# Patient Record
Sex: Female | Born: 1980 | Race: Black or African American | Hispanic: No | Marital: Married | Smoking: Never smoker
Health system: Southern US, Community
[De-identification: ages and names within clinical notes are randomized; demographics above are authoritative.]

## PROBLEM LIST (undated history)

## (undated) DIAGNOSIS — Z972 Presence of dental prosthetic device (complete) (partial): Secondary | ICD-10-CM

## (undated) DIAGNOSIS — Z2821 Immunization not carried out because of patient refusal: Secondary | ICD-10-CM

## (undated) DIAGNOSIS — T7840XA Allergy, unspecified, initial encounter: Secondary | ICD-10-CM

## (undated) DIAGNOSIS — Z01419 Encounter for gynecological examination (general) (routine) without abnormal findings: Secondary | ICD-10-CM

## (undated) DIAGNOSIS — E282 Polycystic ovarian syndrome: Secondary | ICD-10-CM

## (undated) DIAGNOSIS — Z973 Presence of spectacles and contact lenses: Secondary | ICD-10-CM

## (undated) DIAGNOSIS — L209 Atopic dermatitis, unspecified: Secondary | ICD-10-CM

## (undated) HISTORY — DX: Atopic dermatitis, unspecified: L20.9

## (undated) HISTORY — DX: Presence of spectacles and contact lenses: Z97.3

## (undated) HISTORY — PX: CHOLECYSTECTOMY: SHX55

## (undated) HISTORY — DX: Presence of dental prosthetic device (complete) (partial): Z97.2

## (undated) HISTORY — PX: WISDOM TOOTH EXTRACTION: SHX21

## (undated) HISTORY — DX: Allergy, unspecified, initial encounter: T78.40XA

## (undated) HISTORY — DX: Immunization not carried out because of patient refusal: Z28.21

## (undated) HISTORY — DX: Polycystic ovarian syndrome: E28.2

## (undated) HISTORY — DX: Encounter for gynecological examination (general) (routine) without abnormal findings: Z01.419

---

## 2010-10-06 ENCOUNTER — Emergency Department (HOSPITAL_COMMUNITY)
Admission: EM | Admit: 2010-10-06 | Discharge: 2010-10-06 | Disposition: A | Discharge status: Home / Self Care | Payer: BC Managed Care – PPO | Attending: Emergency Medicine | Admitting: Emergency Medicine

## 2010-10-06 ENCOUNTER — Emergency Department (HOSPITAL_COMMUNITY): Payer: BC Managed Care – PPO

## 2010-10-06 DIAGNOSIS — M79609 Pain in unspecified limb: Secondary | ICD-10-CM | POA: Insufficient documentation

## 2010-10-06 DIAGNOSIS — L03039 Cellulitis of unspecified toe: Secondary | ICD-10-CM | POA: Insufficient documentation

## 2010-10-06 DIAGNOSIS — M7989 Other specified soft tissue disorders: Secondary | ICD-10-CM | POA: Insufficient documentation

## 2010-10-06 DIAGNOSIS — L02619 Cutaneous abscess of unspecified foot: Secondary | ICD-10-CM | POA: Insufficient documentation

## 2010-10-06 DIAGNOSIS — J45909 Unspecified asthma, uncomplicated: Secondary | ICD-10-CM | POA: Insufficient documentation

## 2010-10-20 ENCOUNTER — Ambulatory Visit (INDEPENDENT_AMBULATORY_CARE_PROVIDER_SITE_OTHER): Payer: BC Managed Care – PPO | Admitting: Family Medicine

## 2010-10-20 DIAGNOSIS — J309 Allergic rhinitis, unspecified: Secondary | ICD-10-CM

## 2010-10-20 DIAGNOSIS — J45909 Unspecified asthma, uncomplicated: Secondary | ICD-10-CM

## 2010-11-10 ENCOUNTER — Encounter: Payer: BC Managed Care – PPO | Admitting: Family Medicine

## 2011-03-30 ENCOUNTER — Ambulatory Visit (INDEPENDENT_AMBULATORY_CARE_PROVIDER_SITE_OTHER): Payer: BC Managed Care – PPO | Admitting: Medical

## 2011-03-30 ENCOUNTER — Other Ambulatory Visit: Payer: Self-pay | Admitting: Medical

## 2011-03-30 ENCOUNTER — Encounter: Payer: Self-pay | Admitting: Medical

## 2011-03-30 VITALS — BP 130/90 | HR 78 | Temp 97.7°F | Resp 20 | Ht 61.0 in | Wt 175.0 lb

## 2011-03-30 DIAGNOSIS — J329 Chronic sinusitis, unspecified: Secondary | ICD-10-CM

## 2011-03-30 DIAGNOSIS — IMO0001 Reserved for inherently not codable concepts without codable children: Secondary | ICD-10-CM

## 2011-03-30 DIAGNOSIS — L03019 Cellulitis of unspecified finger: Secondary | ICD-10-CM

## 2011-03-30 MED ORDER — ALBUTEROL SULFATE HFA 108 (90 BASE) MCG/ACT IN AERS: INHALATION_SPRAY | RESPIRATORY_TRACT | Status: DC

## 2011-03-30 MED ORDER — HYDROCODONE-ACETAMINOPHEN 5-500 MG PO TABS: 1.0000 | ORAL_TABLET | ORAL | Status: DC | PRN

## 2011-03-30 MED ORDER — AMOXICILLIN-POT CLAVULANATE 875-125 MG PO TABS: 1.0000 | ORAL_TABLET | Freq: Two times a day (BID) | ORAL | Status: AC

## 2011-03-30 NOTE — Patient Instructions (Signed)
I refilled your albuterol inhaler today.  I am treating your sinus infection and finger infection today with Augmentin.    Keep the finger clean and dry.  You may use a sterile bandage twice daily until the wound heals.    Call if any concerns.  Recheck on both sinuses and finger in 1 week if not better.  Paronychia (Felon, Whitlow) Paronychia is an inflammatory reaction involving the folds of the skin surrounding the fingernail. This is commonly caused by an infection in the skin around a nail. The most common cause of paronychia is frequent wetting of the hands (as seen with bartenders, food servers, nurses or others who wet their hands). This makes the skin around the fingernail susceptible to infection by bacteria (germs) or fungus. Other predisposing factors are:  Aggressive manicuring.   Nail biting.   Thumb sucking.  The most common cause is a staphylococcal (a type of germ) infection, or a fungal (Candida) infection. When caused by a germ, it usually comes on suddenly with redness, swelling, pus and is often painful. It may get under the nail and form an abscess (collection of pus), or form an abscess around the nail. If the nail itself is infected with a fungus, the treatment is usually prolonged and may require oral medicine for up to one year. Your caregiver will determine the length of time treatment is required. The paronychia caused by bacteria (germs) may largely be avoided by not pulling on hangnails or picking at cuticles. When the infection occurs at the tips of the finger it is called felon. When the cause of paronychia is from the herpes simplex virus (HSV) it is called herpetic whitlow. TREATMENT  When an abscess is present treatment is often incision and drainage. This means that the abscess must be cut open so the pus can get out. When this is done, the following home care instructions should be followed.  HOME CARE INSTRUCTIONS  It is important to keep the affected fingers  very dry. Rubber or plastic gloves over cotton gloves should be used whenever the hand must be placed in water.   Keep wound clean, dry and dressed as suggested by your caregiver between warm soaks or warm compresses.   Soak in warm water for fifteen to twenty minutes three to four times per day for bacterial infections. Fungal infections are very difficult to treat, so often require treatment for long periods of time.   For bacterial (germ) infections take antibiotics (medicine which kill germs) as directed and finish the prescription, even if the problem appears to be solved before the medicine is gone.   Only take over-the-counter or prescription medicines for pain, discomfort, or fever as directed by your caregiver.  SEEK IMMEDIATE MEDICAL CARE IF :  There is redness, swelling, or increasing pain in the wound.   Pus is coming from wound.   An unexplained oral temperature above 101 develops.   You notice a foul smell coming from the wound or dressing.  Document Released: 12/29/2000 Document Re-Released: 10/01/2008 [facility] Patient Information 2011 [place], [place].  Sinusitis Sinuses are air pockets within the bones of your face. The growth of bacteria within a sinus leads to infection. Infection keeps the sinuses from draining. This infection is called sinusitis. SYMPTOMS There will be different areas of pain depending on which sinuses have become infected.  The maxillary sinuses often produce pain beneath the eyes.   Frontal sinusitis may cause pain in the middle of the forehead and above the  eyes.  Other problems (symptoms) include:  Toothaches.   Colored, pus-like (purulent) drainage from the nose.   Any swelling, warmth, or tenderness over the sinus areas may be signs of infection.  TREATMENT Sinusitis is most often determined by an exam and you may have x-rays taken. If x-rays have been taken, make sure you obtain your results. Or find out how you are to obtain them. Your  caregiver may give you medications (antibiotics). These are medications that will help kill the infection. You may also be given a medication (decongestant) that helps to reduce sinus swelling.  HOME CARE INSTRUCTIONS  Only take over-the-counter or prescription medicines for pain, discomfort, or fever as directed by your caregiver.   Drink extra fluids. Fluids help thin the mucus so your sinuses can drain more easily.   Applying either moist heat or ice packs to the sinus areas may help relieve discomfort.   Use saline nasal sprays to help moisten your sinuses. The sprays can be found at your local drugstore.  SEEK IMMEDIATE MEDICAL CARE IF YOU DEVELOP:  High fever that is still present after two days of antibiotic treatment.   Increasing pain, severe headaches, or toothache.   Nausea, vomiting, or drowsiness.   Unusual swelling around the face or trouble seeing.  MAKE SURE YOU:   Understand these instructions.   Will watch your condition.   Will get help right away if you are not doing well or get worse.  Document Released: 07/05/2005 Document Re-Released: 06/17/2008 [facility] Patient Information 2011 [place], [place].

## 2011-03-30 NOTE — Progress Notes (Signed)
Subjective:   HPI  Regina Stevens is a 30 y.o. female who presents for for c/o right middle finger pain and swelling x 3 days, but worse overnight. Denies injury to finger, no recent nail biting or manicure.  No prior similar.  No drainage.     She notes sinus pressure, mucous and congestion in nose/sinuses, thick yellow green discharge from nose x 2 days.  No other aggravating or relieving factors.  No other c/o.  The following portions of the patient's history were reviewed and updated as appropriate: allergies, current medications, past family history, past medical history, past social history, past surgical history and problem list.  Past Medical History  Diagnosis Date  . Asthma   . Allergy    Review of Systems Constitutional: anorexia; denies fever, chills, sweats  Allergy: +congestion, sneezing Cardiology: denies chest pain, palpitations, edema Respiratory: denies cough, shortness of breath, wheezing Gastroenterology: denies abdominal pain, nausea, vomiting, diarrhea, constipation Neurology: no headache, weakness, tingling, numbness      Objective:   Physical Exam  General appearance: alert, no distress, WD/WN, ill appearing Skin: right middle finger with soft tisue erythema, tenderness and swelling beside nail bed c/w paronychia  HEENT: normocephalic, nares patent, +swelling of turbinates, muculopurulent discharge, pharynx normal Oral cavity: MMM, no lesions Neck: supple, no lymphadenopathy, no thyromegaly, no masses Heart: RRR, normal S1, S2, no murmurs Lungs: CTA bilaterally, no wheezes, rhonchi, or rales Pulses: 2+ symmetric, upper extremities, normal cap refill Neurological: normal finger sensation and strength   Assessment :    Encounter Diagnoses  Name Primary?  . Paronychia of third finger, right Yes  . Sinusitis     Plan:   Paronychia - right middle finger.  Discussed risks and benefits of procedure and she gives consent.  Use 1% lidocaine without  epi for digital block.  Cleaned and prepped in usual sterile fashion.  Used #11 blade to incise.  <0.5 cc of pus expressed, culture taken.  Discussed wound care, epsom salt soaks.  Begin Augmentin, Lortab for pain.  Call if worse in the next 1-2 days.    Sinusitis - hydrate well, rest, discussed syptomatic therapy.  Augmentin script given  Call if not improving in the next few days.

## 2011-04-02 ENCOUNTER — Telehealth: Payer: Self-pay | Admitting: *Deleted

## 2011-04-02 NOTE — Telephone Encounter (Addendum)
Message copied by Dorthula Perfect on Fri Apr 02, 2011 11:17 AM ------      Message from: Aleen Campi, DAVID S      Created: Thu Apr 01, 2011  7:35 AM       So far no growth on culture.  Call and see how she is doing? How is the finger?  Spoke with pt regarding culture from finger and informed pt that no growth on culture.  Pt stated she is doing fine and that finger is getting better, the swelling is going down but still a little sore.  Advised pt to call if finger got worse. CM, LPN

## 2011-04-26 ENCOUNTER — Encounter: Payer: Self-pay | Admitting: Family Medicine

## 2011-04-27 ENCOUNTER — Ambulatory Visit (INDEPENDENT_AMBULATORY_CARE_PROVIDER_SITE_OTHER): Payer: BC Managed Care – PPO | Admitting: Medical

## 2011-04-27 ENCOUNTER — Encounter: Payer: Self-pay | Admitting: Medical

## 2011-04-27 VITALS — BP 100/80 | HR 62 | Temp 98.3°F | Resp 16 | Ht 59.5 in | Wt 176.0 lb

## 2011-04-27 DIAGNOSIS — J309 Allergic rhinitis, unspecified: Secondary | ICD-10-CM

## 2011-04-27 DIAGNOSIS — J45909 Unspecified asthma, uncomplicated: Secondary | ICD-10-CM

## 2011-04-27 DIAGNOSIS — Z111 Encounter for screening for respiratory tuberculosis: Secondary | ICD-10-CM

## 2011-04-27 DIAGNOSIS — Z0000 Encounter for general adult medical examination without abnormal findings: Secondary | ICD-10-CM

## 2011-04-27 DIAGNOSIS — E663 Overweight: Secondary | ICD-10-CM

## 2011-04-27 DIAGNOSIS — B3731 Acute candidiasis of vulva and vagina: Secondary | ICD-10-CM

## 2011-04-27 DIAGNOSIS — B373 Candidiasis of vulva and vagina: Secondary | ICD-10-CM

## 2011-04-27 LAB — POCT URINALYSIS DIPSTICK
Blood, UA: NEGATIVE
Glucose, UA: NEGATIVE
Ketones, UA: NEGATIVE
Spec Grav, UA: 1.015

## 2011-04-27 MED ORDER — TUBERCULIN PPD 5 UNIT/0.1ML ID SOLN: 5.0000 [IU] | Freq: Once | INTRADERMAL | Status: DC

## 2011-04-27 MED ORDER — MOMETASONE FUROATE 50 MCG/ACT NA SUSP: 2.0000 | Freq: Every day | NASAL | Status: DC

## 2011-04-27 MED ORDER — FLUCONAZOLE 150 MG PO TABS: ORAL_TABLET | ORAL | Status: AC

## 2011-04-27 NOTE — Patient Instructions (Signed)
I want you to work on weight loss.  Try and maintain 1800 calories per day diet.  Change up your exercise routine regularly to trick the body to lose weight.  Don't skip meals in general.    Preventative Care for Adults - Female      MAINTAIN REGULAR HEALTH EXAMS:  A routine yearly physical is a good way to check in with your primary care provider about your health and preventive screening. It is also an opportunity to share updates about your health and any concerns you have, and receive a thorough all-over exam.   Most health insurance companies pay for at least some preventative services.  Check with your health plan for specific coverages.  WHAT PREVENTATIVE SERVICES DO WOMEN NEED?  Adult women should have their weight and blood pressure checked regularly.   Women age 51 and older should have their cholesterol levels checked regularly.  Women should be screened for cervical cancer with a Pap smear and pelvic exam beginning at either age 5, or 3 years after they become sexually activity.    Breast cancer screening generally begins at age 49 with a mammogram and breast exam by your primary care provider.    Beginning at age 43 and continuing to age 29, women should be screened for colorectal cancer.  Certain people may need continued testing until age 76.  Updating vaccinations is part of preventative care.  Vaccinations help protect against diseases such as the flu.  Osteoporosis is a disease in which the bones lose minerals and strength as we age. Women ages 29 and over should discuss this with their caregivers, as should women after menopause who have other risk factors.  Lab tests are generally done as part of preventative care to screen for anemia and blood disorders, to screen for problems with the kidneys and liver, to screen for bladder problems, to check blood sugar, and to check your cholesterol level.  Preventative services generally include counseling about diet, exercise,  avoiding tobacco, drugs, excessive alcohol consumption, and sexually transmitted infections.    GENERAL RECOMMENDATIONS FOR GOOD HEALTH:  Healthy diet:  Eat a variety of foods, including fruit, vegetables, animal or vegetable protein, such as meat, fish, chicken, and eggs, or beans, lentils, tofu, and grains, such as rice.  Drink plenty of water daily.  Decrease saturated fat in the diet, avoid lots of red meat, processed foods, sweets, fast foods, and fried foods.  Exercise:  Aerobic exercise helps maintain good heart health. At least 30-40 minutes of moderate-intensity exercise is recommended. For example, a brisk walk that increases your heart rate and breathing. This should be done on most days of the week.   Find a type of exercise or a variety of exercises that you enjoy so that it becomes a part of your daily life.  Examples are running, walking, swimming, water aerobics, and biking.  For motivation and support, explore group exercise such as aerobic class, spin class, Zumba, Yoga,or  martial arts, etc.    Set exercise goals for yourself, such as a certain weight goal, walk or run in a race such as a 5k walk/run.  Speak to your primary care provider about exercise goals.  Disease prevention:  If you smoke or chew tobacco, find out from your caregiver how to quit. It can literally save your life, no matter how long you have been a tobacco user. If you do not use tobacco, never begin.   Maintain a healthy diet and normal weight. Increased  weight leads to problems with blood pressure and diabetes.   The Body Mass Index or BMI is a way of measuring how much of your body is fat. Having a BMI above 27 increases the risk of heart disease, diabetes, hypertension, stroke and other problems related to obesity. Your caregiver can help determine your BMI and based on it develop an exercise and dietary program to help you achieve or maintain this important measurement at a healthful  level.  High blood pressure causes heart and blood vessel problems.  Persistent high blood pressure should be treated with medicine if weight loss and exercise do not work.   Fat and cholesterol leaves deposits in your arteries that can block them. This causes heart disease and vessel disease elsewhere in your body.  If your cholesterol is found to be high, or if you have heart disease or certain other medical conditions, then you may need to have your cholesterol monitored frequently and be treated with medication.   Ask if you should have a cardiac stress test if your history suggests this. A stress test is a test done on a treadmill that looks for heart disease. This test can find disease prior to there being a problem.  Menopause can be associated with physical symptoms and risks. Hormone replacement therapy is available to decrease these. You should talk to your caregiver about whether starting or continuing to take hormones is right for you.   Osteoporosis is a disease in which the bones lose minerals and strength as we age. This can result in serious bone fractures. Risk of osteoporosis can be identified using a bone density scan. Women ages 93 and over should discuss this with their caregivers, as should women after menopause who have other risk factors. Ask your caregiver whether you should be taking a calcium supplement and Vitamin D, to reduce the rate of osteoporosis.   Avoid drinking alcohol in excess (more than two drinks per day).  Avoid use of street drugs. Do not share needles with anyone. Ask for professional help if you need assistance or instructions on stopping the use of alcohol, cigarettes, and/or drugs.  Brush your teeth twice a day with fluoride toothpaste, and floss once a day. Good oral hygiene prevents tooth decay and gum disease. The problems can be painful, unattractive, and can cause other health problems. Visit your dentist for a routine oral and dental check up and  preventive care every 6-12 months.   Look at your skin regularly.  Use a mirror to look at your back. Notify your caregivers of changes in moles, especially if there are changes in shapes, colors, a size larger than a pencil eraser, an irregular border, or development of new moles.  Safety:  Use seatbelts 100% of the time, whether driving or as a passenger.  Use safety devices such as hearing protection if you work in environments with loud noise or significant background noise.  Use safety glasses when doing any work that could send debris in to the eyes.  Use a helmet if you ride a bike or motorcycle.  Use appropriate safety gear for contact sports.  Talk to your caregiver about gun safety.  Use sunscreen with a SPF (or skin protection factor) of 15 or greater.  Lighter skinned people are at a greater risk of skin cancer. Don't forget to also wear sunglasses in order to protect your eyes from too much damaging sunlight. Damaging sunlight can accelerate cataract formation.   Practice safe sex. Use condoms.  Condoms are used for birth control and to help reduce the spread of sexually transmitted infections (or STIs).  Some of the STIs are gonorrhea (the clap), chlamydia, syphilis, trichomonas, herpes, HPV (human papilloma virus) and HIV (human immunodeficiency virus) which causes AIDS. The herpes, HIV and HPV are viral illnesses that have no cure. These can result in disability, cancer and death.   Keep carbon monoxide and smoke detectors in your home functioning at all times. Change the batteries every 6 months or use a model that plugs into the wall.   Vaccinations:  Stay up to date with your tetanus shots and other required immunizations. You should have a booster for tetanus every 10 years. Be sure to get your flu shot every year, since 5%-20% of the [place] population comes down with the flu. The flu vaccine changes each year, so being vaccinated once is not enough. Get your shot in the fall, before  the flu season peaks.   Other vaccines to consider:  Human Papilloma Virus or HPV causes cancer of the cervix, and other infections that can be transmitted from person to person. There is a vaccine for HPV, and females should get immunized between the ages of 22 and 42. It requires a series of 3 shots.   Pneumococcal vaccine to protect against certain types of pneumonia.  This is normally recommended for adults age 70 or older.  However, adults younger than 29 years old with certain underlying conditions such as diabetes, heart or lung disease should also receive the vaccine.  Shingles vaccine to protect against Varicella Zoster if you are older than age 65, or younger than 29 years old with certain underlying illness.  Hepatitis A vaccine to protect against a form of infection of the liver by a virus acquired from food.  Hepatitis B vaccine to protect against a form of infection of the liver by a virus acquired from blood or body fluids, particularly if you work in health care.  If you plan to travel internationally, check with your local health department for specific vaccination recommendations.  Cancer Screening:  Breast cancer screening is essential to preventive care for women. All women age 71 and older should perform a breast self-exam every month. At age 13 and older, women should have their caregiver complete a breast exam each year. Women at ages 69 and older should have a mammogram (x-ray film) of the breasts. Your caregiver can discuss how often you need mammograms.    Cervical cancer screening includes taking a Pap smear (sample of cells examined under a microscope) from the cervix (end of the uterus). It also includes testing for HPV (Human Papilloma Virus, which can cause cervical cancer). Screening and a pelvic exam should begin at age 29, or 3 years after a woman becomes sexually active. Screening should occur every year, with a Pap smear but no HPV testing, up to age 59. After  age 60, you should have a Pap smear every 3 years with HPV testing, if no HPV was found previously.   Most routine colon cancer screening begins at the age of 64. On a yearly basis, doctors may provide special easy to use take-home tests to check for hidden blood in the stool. Sigmoidoscopy or colonoscopy can detect the earliest forms of colon cancer and is life saving. These tests use a small camera at the end of a tube to directly examine the colon. Speak to your caregiver about this at age 33, when routine screening begins (and is  repeated every 5 years unless early forms of pre-cancerous polyps or small growths are found).

## 2011-04-27 NOTE — Progress Notes (Signed)
Subjective:   HPI  Regina Stevens is a 30 y.o. female who presents for a complete physical.  I saw her recently for paronychia with resolved after I&D and Augmentin.    She recently had some lab work through gynecology (Dr. Annamaria Helling at [facility]) at her yearly visit. Pap was normal.  Gyn hx/o T0 P0 A1 L0. Last tetanus booster a few years.  Decline flu shot.    Asthma is relatively controlled on current medication.  Flare ups usually from a scent or odor.  Last flare up 4 nights ago.    Has some ongoing sinus congestion.  Saw me recently for paronychia and sinusitis, and only used Augmentin for 4 days.  She c/o itching and whitish vaginal discharge since beginning Augmenting, and thins its a yeast infection due to begin on the antibiotic.  She has had this in the past from antibiotics.  Still has the sinus congestion despite her current medication.  Reviewed their medical, surgical, family, social, medication, and allergy history and updated chart as appropriate.  Past Medical History  Diagnosis Date  . Asthma   . Allergy   . Spontaneous abortion 2006  . Wears glasses     Past Surgical History  Procedure Date  . Cholecystectomy     Family History  Problem Relation Age of Onset  . Hypertension Mother   . Angina Mother   . Cancer Paternal Uncle   . Heart disease Maternal Grandmother   . Cancer Maternal Grandmother   . Stroke Neg Hx   . Diabetes Neg Hx   . COPD Neg Hx   . Hyperlipidemia Neg Hx   . Kidney disease Neg Hx   . Asthma Other     History   Social History  . Marital Status: Married    Spouse Name: N/A    Number of Children: N/A  . Years of Education: N/A   Occupational History  . unemployed    Social History Main Topics  . Smoking status: Never Smoker   . Smokeless tobacco: Never Used  . Alcohol Use: No  . Drug Use: No  . Sexually Active: Not on file   Other Topics Concern  . Not on file   Social History Narrative   Exercise -  walks 2 miles daily; No children    Current Outpatient Prescriptions on File Prior to Visit  Medication Sig Dispense Refill  . albuterol (PROVENTIL HFA;VENTOLIN HFA) 108 (90 BASE) MCG/ACT inhaler 2 puffs q6 hours as needed for wheezing  1 Inhaler  3  . fexofenadine (ALLEGRA) 30 MG tablet Take 30 mg by mouth 2 (two) times daily.        . fluticasone (FLONASE) 50 MCG/ACT nasal spray Place 2 sprays into the nose daily.        . fluticasone-salmeterol (ADVAIR HFA) 115-21 MCG/ACT inhaler Inhale 2 puffs into the lungs daily.        . montelukast (SINGULAIR) 10 MG tablet Take 10 mg by mouth at bedtime.          Allergies  Allergen Reactions  . Benadryl (Altaryl)     Swelling, hives  . Iodine   . Morphine And Related   . Shellfish Allergy      Review of Systems Constitutional: denies fever, chills, sweats, unexpected weight change, anorexia, fatigue Allergy: +sneezing, itching, congestion Dermatology: denies changing moles, rash, lumps, new worrisome lesions ENT: no runny nose, ear pain, sore throat, hoarseness, teeth pain, tinnitus, hearing loss, epistaxis Cardiology: denies  chest pain, palpitations, edema, orthopnea, paroxysmal nocturnal dyspnea Respiratory: denies cough, shortness of breath, dyspnea on exertion, wheezing, hemoptysis Gastroenterology: denies abdominal pain, nausea, vomiting, diarrhea, constipation, blood in stool, changes in bowel movement, dysphagia Hematology: denies bleeding or bruising problems Musculoskeletal: denies arthralgias, myalgias, joint swelling, back pain, neck pain, cramping, gait changes Ophthalmology: denies vision changes, eye redness, itching, discharge Urology: denies dysuria, difficulty urinating, hematuria, urinary frequency, urgency, incontinence Neurology: no headache, weakness, tingling, numbness, speech abnormality, memory loss, falls, dizziness Psychology: denies depressed mood, agitation, sleep problems     Objective:   Physical  Exam  Filed Vitals:   04/27/11 1034  BP: 100/80  Temp: 98.3 F (36.8 C)    General appearance: alert, no distress, WD/WN, black  female, looks stated age, overweight Skin: unremarkable, few scattered benign appearing macules HEENT: normocephalic, conjunctiva/corneas normal, sclerae anicteric, PERRLA, EOMi, nares with clear discharge, right turbinate swollen and pale, not patent, pharynx normal Oral cavity: MMM, tongue normal, few bottom teeth with decay, but otherwise teeth in good repair Neck: supple, no lymphadenopathy, no thyromegaly, no masses, normal ROM, no bruits Chest: non tender, normal shape and expansion Heart: RRR, normal S1, S2, no murmurs Lungs: CTA bilaterally, no wheezes, rhonchi, or rales Abdomen: +bs, soft, non tender, non distended, no masses, no hepatomegaly, no splenomegaly, no bruits Back: non tender, normal ROM, no scoliosis Musculoskeletal: upper extremities non tender, no obvious deformity, normal ROM throughout, lower extremities non tender, no obvious deformity, normal ROM throughout Extremities: no edema, no cyanosis, no clubbing Pulses: 2+ symmetric, upper and lower extremities, normal cap refill Neurological: alert, oriented x 3, CN2-12 intact, strength normal upper extremities and lower extremities, sensation normal throughout, DTRs 2+ throughout, no cerebellar signs, gait normal Psychiatric: normal affect, behavior normal, pleasant  Breast/pelvic deferred to gyn   Assessment :    Encounter Diagnoses  Name Primary?  . Routine general medical examination at a health care facility Yes  . Overweight   . PPD screening test   . Yeast vaginitis   . Asthma   . Allergic rhinitis      Plan:    Physical exam - I reviewed labs and records that were faxed over by gynecology.  Pap smear normal 03/03/11, CBC, CMET, Lipid, TSH all normal from 03/04/11.  Discussed healthy lifestyle, diet, exercise, preventative care, vaccinations, and addressed their concerns.   F/u soon as planned with dentist for lower caries.  See eye doctor yearly.  Overweight - advised 1800 calories/day diet until desired weight reached.  We discussed exercise, diet, strategies to lose weight.  recheck 41mo.  PPD screening - PPD placed.  Return in 48-72 hours for reading.  Adoption form pending PPD reading.    Yeast Vaginitis - Diflucan script given.    Asthma - controlled on current medication.  Allergies - will try switching to sample of nasonex instead of Fluticasone for next several weeks.  If not improving cal back. Otherwise c/t same regimen.

## 2011-04-29 ENCOUNTER — Other Ambulatory Visit: Payer: BC Managed Care – PPO

## 2011-04-29 LAB — TB SKIN TEST
Induration: 48
TB Skin Test: NEGATIVE mm

## 2011-04-29 NOTE — Progress Notes (Signed)
Patient had a TB skin test read today at our office today and the results from the test was negative with 0 mm. I made a mistaken and add 48 in the mm section I thought it was the length of time since she had and the time she came in for it to be read. CLS

## 2011-06-11 ENCOUNTER — Emergency Department (HOSPITAL_COMMUNITY): Payer: BC Managed Care – PPO

## 2011-06-11 ENCOUNTER — Emergency Department (HOSPITAL_COMMUNITY)
Admission: EM | Admit: 2011-06-11 | Discharge: 2011-06-11 | Disposition: A | Discharge status: Home / Self Care | Payer: BC Managed Care – PPO | Attending: Emergency Medicine | Admitting: Emergency Medicine

## 2011-06-11 ENCOUNTER — Encounter (HOSPITAL_COMMUNITY): Payer: Self-pay | Admitting: *Deleted

## 2011-06-11 DIAGNOSIS — J45909 Unspecified asthma, uncomplicated: Secondary | ICD-10-CM | POA: Insufficient documentation

## 2011-06-11 DIAGNOSIS — M25569 Pain in unspecified knee: Secondary | ICD-10-CM | POA: Insufficient documentation

## 2011-06-11 DIAGNOSIS — M79609 Pain in unspecified limb: Secondary | ICD-10-CM | POA: Insufficient documentation

## 2011-06-11 DIAGNOSIS — IMO0002 Reserved for concepts with insufficient information to code with codable children: Secondary | ICD-10-CM | POA: Insufficient documentation

## 2011-06-11 DIAGNOSIS — S8391XA Sprain of unspecified site of right knee, initial encounter: Secondary | ICD-10-CM

## 2011-06-11 DIAGNOSIS — Z79899 Other long term (current) drug therapy: Secondary | ICD-10-CM | POA: Insufficient documentation

## 2011-06-11 DIAGNOSIS — Z9889 Other specified postprocedural states: Secondary | ICD-10-CM | POA: Insufficient documentation

## 2011-06-11 DIAGNOSIS — X500XXA Overexertion from strenuous movement or load, initial encounter: Secondary | ICD-10-CM | POA: Insufficient documentation

## 2011-06-11 DIAGNOSIS — M25559 Pain in unspecified hip: Secondary | ICD-10-CM | POA: Insufficient documentation

## 2011-06-11 LAB — POCT PREGNANCY, URINE: Preg Test, Ur: NEGATIVE

## 2011-06-11 MED ORDER — HYDROMORPHONE HCL PF 2 MG/ML IJ SOLN
2.0000 mg | Freq: Once | INTRAMUSCULAR | Status: AC
  Administered 2011-06-11: 2 mg via INTRAMUSCULAR
  Filled 2011-06-11: qty 1

## 2011-06-11 MED ORDER — OXYCODONE-ACETAMINOPHEN 5-325 MG PO TABS: 1.0000 | ORAL_TABLET | ORAL | Status: AC | PRN

## 2011-06-11 MED ORDER — IBUPROFEN 800 MG PO TABS: 800.0000 mg | ORAL_TABLET | Freq: Three times a day (TID) | ORAL | Status: AC

## 2011-06-11 NOTE — ED Notes (Signed)
Waiting for return call from ortho

## 2011-06-11 NOTE — ED Notes (Signed)
Patient states she twisted her right leg when cleaning on Thurs morning.  She states she has not been able to bend or straighten her leg since the event.

## 2011-06-11 NOTE — ED Provider Notes (Signed)
Medical screening examination/treatment/procedure(s) were performed by non-physician practitioner and as supervising physician I was immediately available for consultation/collaboration.   Lyanne Co, MD 06/11/11 [id number]

## 2011-06-11 NOTE — ED Provider Notes (Signed)
History     CSN: [id number] Arrival date & time: 06/11/2011  7:40 AM   First MD Initiated Contact with Patient 06/11/11 0757     HPI Regina Stevens is a 30 y.o. F states while cleaning the bathtub yesterday she slipped and twisted her right lower extremity. States since then has had severe pain from her right hip down to her right knee. Unable to shriek and leg due to severe pain. Reports able to bend at the hip or knee. Pain is associated with weakness.  Deniesnumbness, tingling, back pain, abdominal pain, edema, ecchymosis, deformity.  Patient is a 30 y.o. female presenting with leg pain.  Leg Pain  The incident occurred yesterday. The incident occurred at home. The injury mechanism was a fall. The pain is present in the right hip, right knee and right thigh. The pain is at a severity of 10/10. The pain is severe. The pain has been constant since onset. Associated symptoms include inability to bear weight and loss of motion. Pertinent negatives include no numbness, no loss of sensation and no tingling. The symptoms are aggravated by activity, bearing weight and palpation. She has tried rest and ice for the symptoms. The treatment provided no relief.    Past Medical History  Diagnosis Date  . Asthma   . Allergy   . Spontaneous abortion 2006  . Wears glasses     Past Surgical History  Procedure Date  . Cholecystectomy     Family History  Problem Relation Age of Onset  . Hypertension Mother   . Angina Mother   . Cancer Paternal Uncle   . Heart disease Maternal Grandmother   . Cancer Maternal Grandmother   . Stroke Neg Hx   . Diabetes Neg Hx   . COPD Neg Hx   . Hyperlipidemia Neg Hx   . Kidney disease Neg Hx   . Asthma Other     History  Substance Use Topics  . Smoking status: Never Smoker   . Smokeless tobacco: Never Used  . Alcohol Use: No    OB History    Grav Para Term Preterm Abortions TAB SAB Ect Mult Living                  Review of Systems    Constitutional: Negative for fever and chills.  HENT: Negative for neck pain.   Musculoskeletal: Negative for back pain.       Positive for leg pain. Negative for swelling, bruising, obvious deformity.  Skin: Negative for color change and wound.  Neurological: Negative for tingling and numbness.    Allergies  Benadryl; Morphine and related; Shellfish allergy; and Iodine  Home Medications   Current Outpatient Rx  Name Route Sig Dispense Refill  . ALBUTEROL SULFATE HFA 108 (90 BASE) MCG/ACT IN AERS  2 puffs q6 hours as needed for wheezing 1 Inhaler 3  . FEXOFENADINE HCL 30 MG PO TABS Oral Take 30 mg by mouth daily.     Marland Kitchen FLUTICASONE-SALMETEROL 115-21 MCG/ACT IN AERO Inhalation Inhale 2 puffs into the lungs daily.      . MOMETASONE FUROATE 50 MCG/ACT NA SUSP Nasal Place 2 sprays into the nose daily. 17 g 0  . MONTELUKAST SODIUM 10 MG PO TABS Oral Take 10 mg by mouth at bedtime.        Pulse 95  Temp(Src) 98 F (36.7 C) (Oral)  Resp 20  SpO2 98%  Physical Exam  Vitals reviewed. Constitutional: She is oriented to person, place, and  time. Vital signs are normal. She appears well-developed and well-nourished. No distress.  HENT:  Head: Normocephalic and atraumatic.  Eyes: Pupils are equal, round, and reactive to light.  Neck: Neck supple.  Pulmonary/Chest: Effort normal.  Musculoskeletal:       Right hip: She exhibits decreased range of motion and decreased strength. She exhibits no tenderness, no bony tenderness, no swelling, no crepitus, no deformity and no laceration.       Right knee: She exhibits decreased range of motion and bony tenderness. She exhibits no swelling, no effusion, no ecchymosis, no deformity, normal patellar mobility, normal meniscus and no MCL laxity. tenderness found. Medial joint line and lateral joint line tenderness noted. No patellar tendon tenderness noted.       Right upper leg: She exhibits tenderness. She exhibits no bony tenderness, no swelling, no  edema, no deformity and no laceration.       Patient has right hip slightly rotated shortened. States unable to straighten completely tissue severe pain. Normal distal pulses, sensation. Normal ankle range of motion. Pain with palpation from the knee to superior thigh. No hip bony tenderness.  Neurological: She is alert and oriented to person, place, and time.  Skin: Skin is warm and dry. No rash noted. No erythema. No pallor.  Psychiatric: She has a normal mood and affect. Her behavior is normal.    ED Course  Procedures  Results for orders placed during the hospital encounter of 06/11/11  POCT PREGNANCY, URINE      Component Value Range   Preg Test, Ur NEGATIVE     Dg Hip Bilateral W/pelvis  06/11/2011  *RADIOLOGY REPORT*  Clinical Data: Fall, right hip pain  BILATERAL HIP WITH PELVIS - 4+ VIEW  Comparison: None.  Findings: Five views bilateral hip submitted.  No acute fracture or subluxation.  No periosteal reaction or bony erosion.  IMPRESSION: No acute fracture or subluxation.  Original Report Authenticated By: Natasha Mead, M.D.   Dg Knee Complete 4 Views Right  06/11/2011  *RADIOLOGY REPORT*  Clinical Data: Pain post fall  RIGHT KNEE - COMPLETE 4+ VIEW  Comparison: None.  Findings: Four views of the right knee submitted.  No acute fracture or subluxation.  No radiopaque foreign body.  IMPRESSION: No acute fracture or subluxation.  Original Report Authenticated By: Natasha Mead, M.D.     MDM    Discussed x-ray results with patient and family. Suspect a knee sprain. Will provide patient with knee immobilizer. Patient reports she has crutches at home. Discussed in great detail should patient have continued pain should followup with orthopedic physician to rule out meniscal, ligamentous or tendon injury. Negative anterior drawer, posterior drawer valgus and varus. The suspicion for ligamentous injury. However do suspect potential meniscal injury      Thomasene Lot, [place] 06/11/11 1041

## 2011-12-21 ENCOUNTER — Ambulatory Visit (INDEPENDENT_AMBULATORY_CARE_PROVIDER_SITE_OTHER): Payer: BC Managed Care – PPO | Admitting: Medical

## 2011-12-21 ENCOUNTER — Encounter: Payer: Self-pay | Admitting: Medical

## 2011-12-21 VITALS — BP 130/80 | HR 94 | Temp 98.3°F | Resp 16 | Wt 169.0 lb

## 2011-12-21 DIAGNOSIS — K5900 Constipation, unspecified: Secondary | ICD-10-CM

## 2011-12-21 DIAGNOSIS — J329 Chronic sinusitis, unspecified: Secondary | ICD-10-CM

## 2011-12-21 DIAGNOSIS — K921 Melena: Secondary | ICD-10-CM

## 2011-12-21 MED ORDER — POLYETHYLENE GLYCOL 3350 17 GM/SCOOP PO POWD: 17.0000 g | Freq: Two times a day (BID) | ORAL | Status: AC | PRN

## 2011-12-21 MED ORDER — HYDROCODONE-HOMATROPINE 5-1.5 MG/5ML PO SYRP: 5.0000 mL | ORAL_SOLUTION | Freq: Three times a day (TID) | ORAL | Status: AC | PRN

## 2011-12-21 MED ORDER — HYDROCORTISONE 2.5 % RE CREA: TOPICAL_CREAM | Freq: Two times a day (BID) | RECTAL | Status: AC

## 2011-12-21 MED ORDER — DOCUSATE SODIUM 100 MG PO CAPS: 100.0000 mg | ORAL_CAPSULE | Freq: Two times a day (BID) | ORAL | Status: AC

## 2011-12-21 MED ORDER — AZITHROMYCIN 500 MG PO TABS: 500.0000 mg | ORAL_TABLET | Freq: Every day | ORAL | Status: AC

## 2011-12-21 MED ORDER — HYDROCORTISONE ACETATE 25 MG RE SUPP: 25.0000 mg | Freq: Two times a day (BID) | RECTAL | Status: AC

## 2011-12-21 NOTE — Progress Notes (Signed)
Subjective: Here for 2 c/o.  She reports a 1 week hx/o cough, productive of green and yellow sputum.  Last 4 days cough has gotten worse, keeping her up at night.  Sputum started green, now mostly yellow sputum.  She reports hoarseness, throat feels on fire, feels like a cork in her throat at night, bad coughing spells in the day and night, keeping her up at night.  Using Tylenol Cold and Flu with little improvement.  No sick contacts.  Denies fever, headache, NVD, chiills, sweats, sinus pressure.   She does have hx/o asthma, uses Advair with good control, but in the last few days using inhaler twice daily.    Blood in stool - she notes 1 year hx/o blood in stool.  Usually sees BRBPR, but at times the blood is darker.  Lately it occurs every time she wipes.  Sometimes just on toilet paper, sometimes gets a streak of blood.  She does report some straining, some intermittent constipation, has had fissures prior.  She does note some prior hemorrhoids.  Denies prior hemorrhoids in pregnancy.  She does occasionally use Pepto Bismol.  Denies using NSAID regularly.   Denies hx/o colon cancer.  She has BMs about once daily.   Past Medical History  Diagnosis Date  . Asthma   . Allergy   . Spontaneous abortion 2006  . Wears glasses     Objective:   Filed Vitals:   12/21/11 1135  BP: 130/80  Pulse: 94  Temp: 98.3 F (36.8 C)  Resp: 16    General appearance: Alert, WD/WN, no distress, ill appearing                             Skin: warm, no rash, no diaphoresis                           Head: no sinus tenderness                            Eyes: conjunctiva normal, corneas clear, PERRLA                            Ears: pearly TMs, external ear canals normal                          Nose: septum midline, turbinates swollen, with erythema and clear discharge             Mouth/throat: MMM, tongue normal, mild pharyngeal erythema                           Neck: supple, no adenopathy, no thyromegaly,  nontender                          Heart: RRR, normal S1, S2, no murmurs                         Lungs: +bronchial breath sounds, +scattered rhonchi, no wheezes, no rales                Extremities: no edema, nontender Rectal: small inferior hemorrhoids, anus without obvious fissure, no obvious internal hemorrhoids, but +occult stool     Assessment and  Plan:   Encounter Diagnoses  Name Primary?  . Sinusitis Yes  . Blood in stool   . Constipation    Sinusitis - prescription given today for Azithromycin and Hycodan as below.  Discussed diagnosis and treatment of bronchitis.  Suggested symptomatic OTC remedies for cough and congestion.  Nasal saline spray for nasal congestion.  Ibuprofen OTC for fever and malaise.  Call/return in 2-3 days if symptoms are worse or not improving.    Blood in stool - likely due to hemorrhoids vs fissure.  Advise she begin Miralax daily, hydrate well with water daily, discussed fiber in diet, trying wet wipes for comfort, soft tissue. Begin Anusol suppository prn and Anusol cream prn.  Recheck 640mo.  If no improving, refer to GI.  Constipation -same plan as blood in stool.   RTC 640mo.

## 2012-04-07 ENCOUNTER — Other Ambulatory Visit: Payer: Self-pay | Admitting: Family Medicine

## 2012-04-07 NOTE — Telephone Encounter (Signed)
Is this okay?

## 2012-04-07 NOTE — Telephone Encounter (Signed)
Renew these with one refill but have her set up an appointment for consult concerning her allergies and asthma

## 2012-04-11 ENCOUNTER — Ambulatory Visit (INDEPENDENT_AMBULATORY_CARE_PROVIDER_SITE_OTHER): Payer: BC Managed Care – PPO | Admitting: Medical

## 2012-04-11 ENCOUNTER — Encounter: Payer: Self-pay | Admitting: Medical

## 2012-04-11 VITALS — BP 100/70 | HR 92 | Temp 98.1°F | Resp 16 | Wt 168.0 lb

## 2012-04-11 DIAGNOSIS — N926 Irregular menstruation, unspecified: Secondary | ICD-10-CM

## 2012-04-11 DIAGNOSIS — R21 Rash and other nonspecific skin eruption: Secondary | ICD-10-CM

## 2012-04-11 MED ORDER — PERMETHRIN 5 % EX CREA: TOPICAL_CREAM | Freq: Once | CUTANEOUS | Status: DC

## 2012-04-11 NOTE — Progress Notes (Signed)
Subjective: Here for c/o rash/bumps.  Been getting bumps on hands, fingers, forearms the last few days.  Itchy bumps.  No contacts with similar.  No recent travel.  Using nothing for symptoms.   She notes hx/o irregular menstrual periods. She notes some + and - urine pregnancy tests of late.  Has had some nausea, fatigue, but no breast tenderness.  Wants blood test to check for possible pregnancy.  Not trying to get pregnant, but her and husband would be ok with or without pregnancy.    Past Medical History  Diagnosis Date  . Asthma   . Allergy   . Spontaneous abortion 2006  . Wears glasses      ROS as noted in HPI  Objective: Gen: wd, wn, nad Skin: darkened skin around neck suggestive of acanthosis nigricans, scattered small 2-53mm raised papules on several fingers, in between some fingers, and similar rash on forearms Heart: RRR, normal S1, S2, no murmurs Lungs: CTA Abdomen: +bs, soft nontender,no mass,no organomegaly  Assessment: Encounter Diagnoses  Name Primary?  . Rash Yes  . Missed period    Plan: Rash - possible scabies.  Script for permethrin, can repeat in 1 wk.  If not improving, let me know  Missed periods - beta hCG lab today.  She has some symptoms suggestive of PCOS.  Advise she f/u with her gynecologist for routine gyn f/u and evaluation for possible PCOS.

## 2012-04-12 LAB — HCG, QUANTITATIVE, PREGNANCY: hCG, Beta Chain, Quant, S: <2 m[IU]/mL

## 2012-04-18 DIAGNOSIS — Z01419 Encounter for gynecological examination (general) (routine) without abnormal findings: Secondary | ICD-10-CM

## 2012-04-18 HISTORY — DX: Encounter for gynecological examination (general) (routine) without abnormal findings: Z01.419

## 2012-04-19 IMAGING — CR DG HIP W/ PELVIS BILAT
5 series · 5 of 5 positions shown · non-contrast
Comparison: None.

CLINICAL DATA: Fall, right hip pain

BILATERAL HIP WITH PELVIS - 4+ VIEW

[t pelvis a.p.]
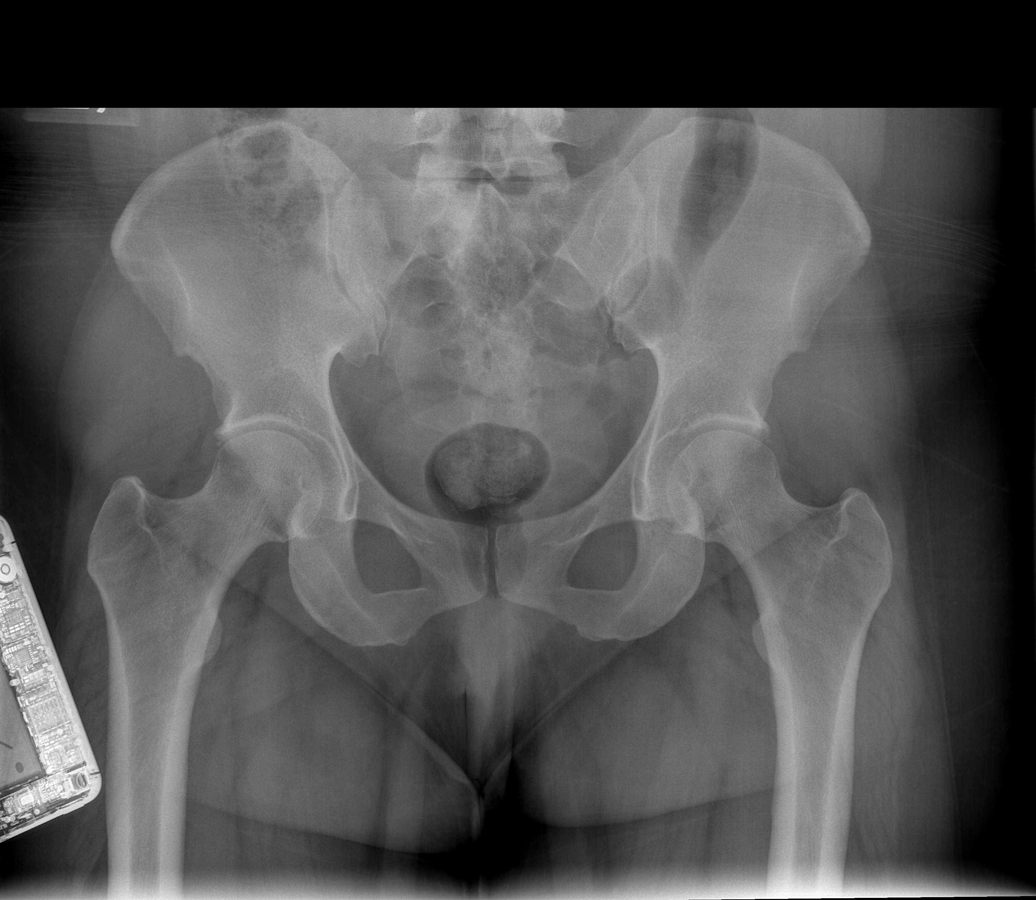

[t hip ap left (1 of 2)]
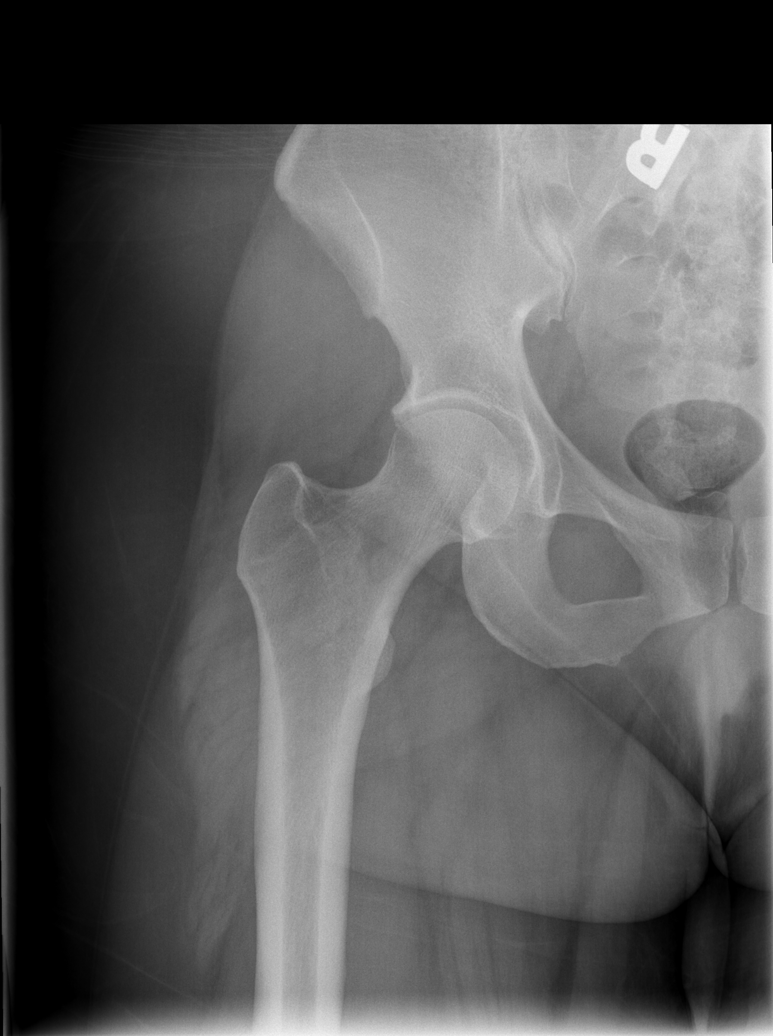

[t hip frog leg left (1 of 2)]
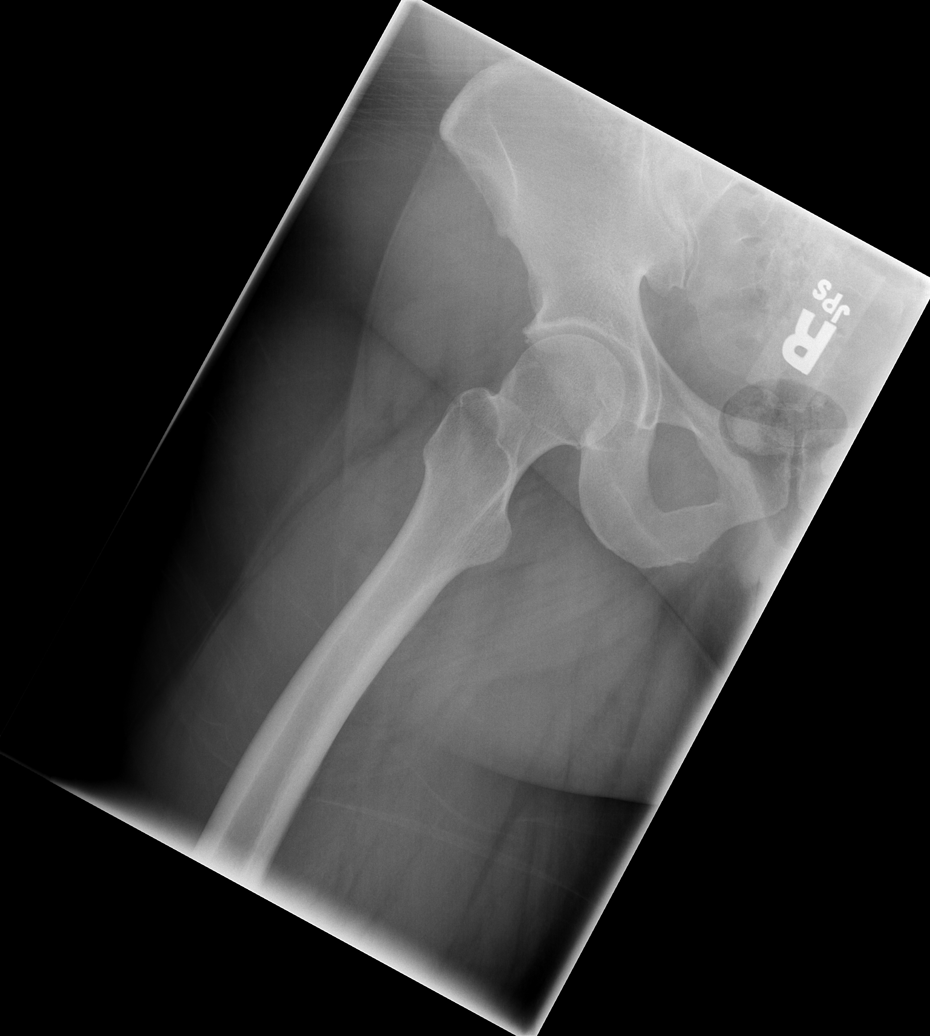

[t hip ap left (2 of 2)]
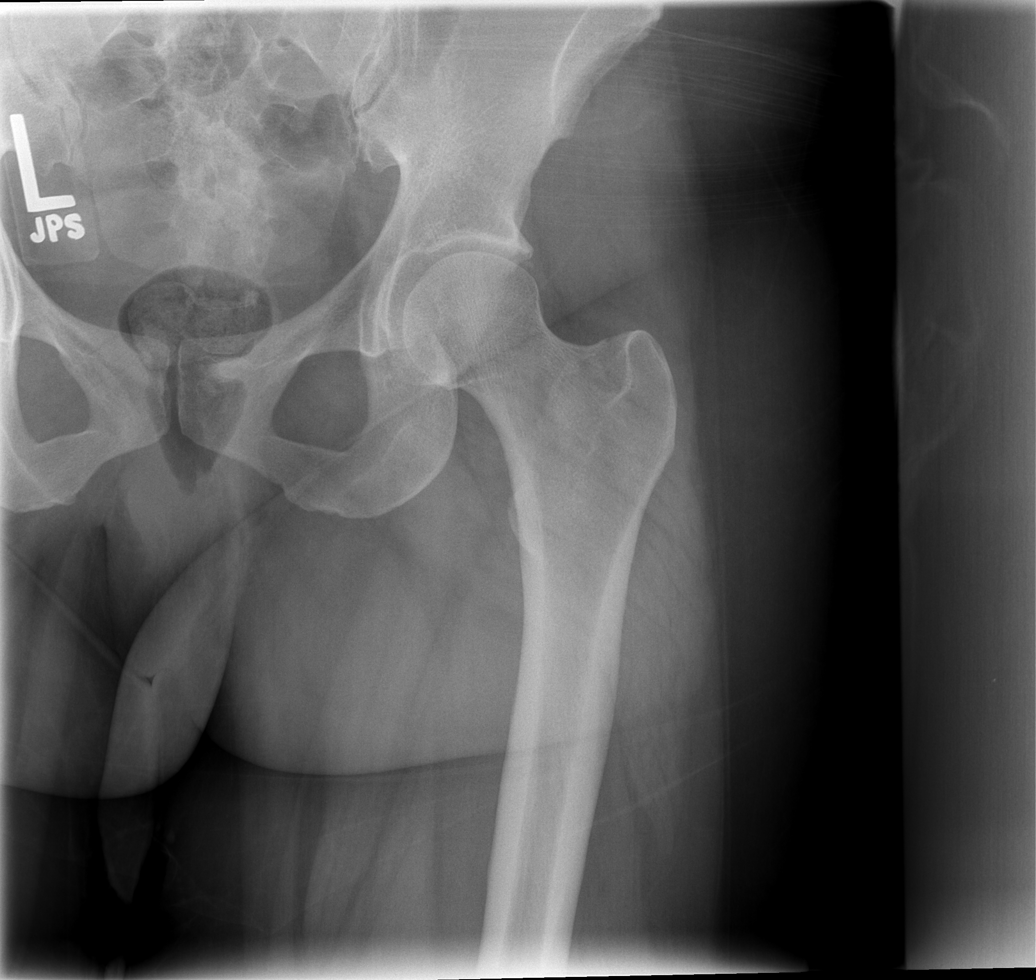

[t hip frog leg left (2 of 2)]
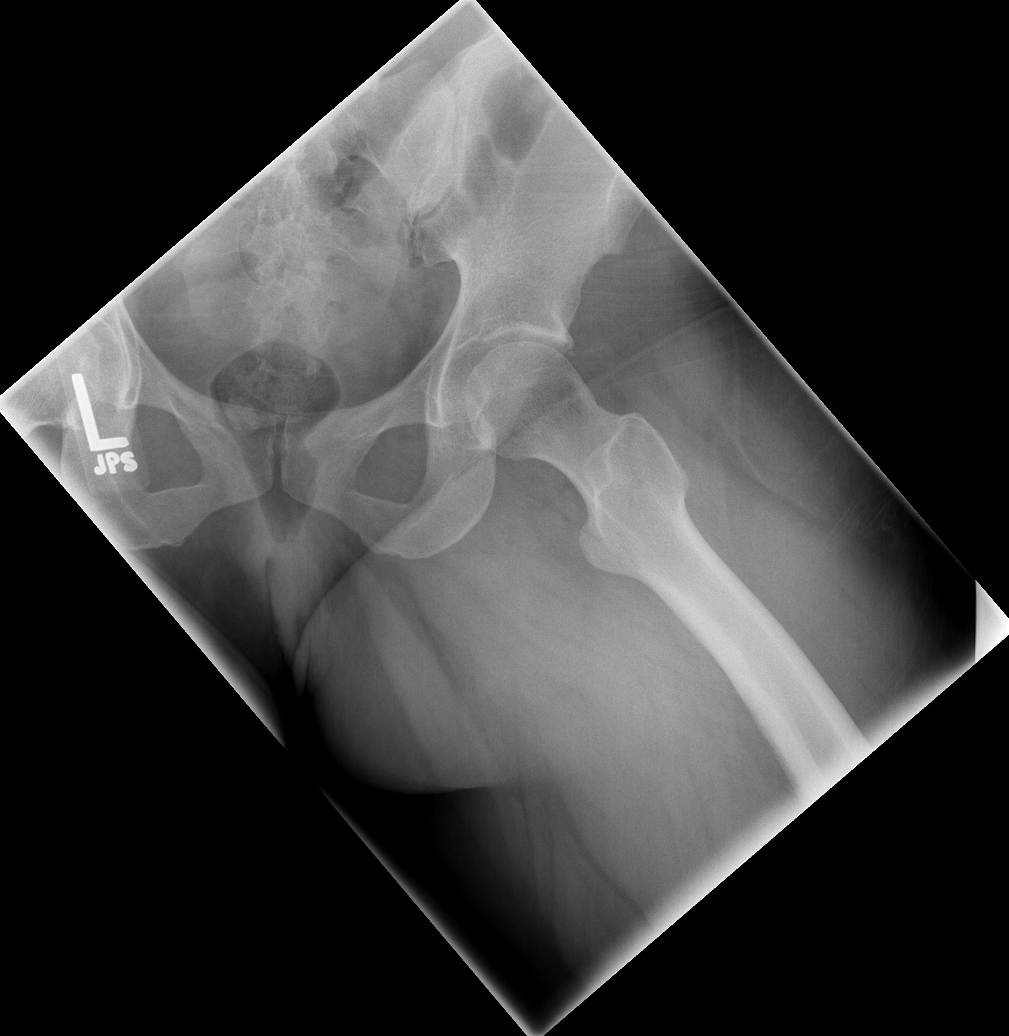

[5 of 5 positions shown; findings below may reference images not displayed]

FINDINGS: Five views bilateral hip submitted.  No acute fracture or
subluxation.  No periosteal reaction or bony erosion.
IMPRESSION: No acute fracture or subluxation.

## 2012-04-25 ENCOUNTER — Other Ambulatory Visit: Payer: Self-pay | Admitting: Obstetrics & Gynecology

## 2012-08-07 ENCOUNTER — Encounter: Payer: Self-pay | Admitting: Medical

## 2012-08-07 ENCOUNTER — Ambulatory Visit (INDEPENDENT_AMBULATORY_CARE_PROVIDER_SITE_OTHER): Payer: BC Managed Care – PPO | Admitting: Medical

## 2012-08-07 VITALS — BP 118/76 | HR 100 | Temp 98.3°F | Resp 18 | Wt 161.0 lb

## 2012-08-07 DIAGNOSIS — R109 Unspecified abdominal pain: Secondary | ICD-10-CM

## 2012-08-07 DIAGNOSIS — R112 Nausea with vomiting, unspecified: Secondary | ICD-10-CM

## 2012-08-07 DIAGNOSIS — R197 Diarrhea, unspecified: Secondary | ICD-10-CM

## 2012-08-07 LAB — POCT URINALYSIS DIPSTICK
Leukocytes, UA: NEGATIVE
Nitrite, UA: NEGATIVE
Urobilinogen, UA: NEGATIVE
pH, UA: 5

## 2012-08-07 LAB — CBC WITH DIFFERENTIAL/PLATELET
Basophils Absolute: 0 10*3/uL (ref 0.0–0.1)
Eosinophils Relative: 0 % (ref 0–5)
HCT: 37.8 % (ref 36.0–46.0)
Lymphocytes Relative: 24 % (ref 12–46)
MCHC: 33.1 g/dL (ref 30.0–36.0)
MCV: 83.6 fL (ref 78.0–100.0)
Monocytes Absolute: 0.3 10*3/uL (ref 0.1–1.0)
Monocytes Relative: 7 % (ref 3–12)
RBC: 4.52 MIL/uL (ref 3.87–5.11)
RDW: 14 % (ref 11.5–15.5)

## 2012-08-07 LAB — BASIC METABOLIC PANEL
BUN: 8 mg/dL (ref 6–23)
Calcium: 9.1 mg/dL (ref 8.4–10.5)
Chloride: 103 mEq/L (ref 96–112)
Creat: 0.77 mg/dL (ref 0.50–1.10)

## 2012-08-07 LAB — HEPATIC FUNCTION PANEL
ALT: 10 U/L (ref 0–35)
AST: 16 U/L (ref 0–37)
Alkaline Phosphatase: 80 U/L (ref 39–117)
Bilirubin, Direct: 0.2 mg/dL (ref 0.0–0.3)
Total Bilirubin: 0.8 mg/dL (ref 0.3–1.2)

## 2012-08-07 MED ORDER — PROMETHAZINE HCL 25 MG/ML IJ SOLN
25.0000 mg | Freq: Once | INTRAMUSCULAR | Status: AC
  Administered 2012-08-07: 25 mg via INTRAMUSCULAR

## 2012-08-07 MED ORDER — PROMETHAZINE HCL 25 MG PO TABS: 25.0000 mg | ORAL_TABLET | Freq: Four times a day (QID) | ORAL | Status: DC | PRN

## 2012-08-07 NOTE — Patient Instructions (Signed)
Encounter Diagnoses  Name Primary?  . Abdominal pain Yes  . Nausea & vomiting   . Diarrhea     likely viral gastroenteritis  Recommendations:  Rest  Stay hydrated - water, gingerale, sips of fluids, ice chips, soup broth, jello, popsickles  As appetite increases and vomiting stops, you can gradually begin BRAT diet - bananas, rice, applesauce, toast  Call/return if fever, blood or mucous in stool, overall not improving or worse  We will call with lab results  Additional information is provided below.  Please call if you have questions or concerns.   Viral Gastroenteritis Viral gastroenteritis is also known as stomach flu. This condition affects the stomach and intestinal tract. The illness typically lasts 3 to 8 days. Most people develop an immune response. This eventually gets rid of the virus. While this natural response develops, the virus can make you quite ill.  CAUSES  Diarrhea and vomiting are often caused by a virus. Medicines (antibiotics) that kill germs will not help unless there is also a germ (bacterial) infection. SYMPTOMS  The most common symptom is diarrhea. This can cause severe loss of fluids (dehydration) and body salt (electrolyte) imbalance. TREATMENT  Treatments for this illness are aimed at rehydration. Antidiarrheal medicines are not recommended. They do not decrease diarrhea volume and may be harmful. Usually, home treatment is all that is needed. The most serious cases involve vomiting so severely that you are not able to keep down fluids taken by mouth (orally). In these cases, intravenous (IV) fluids are needed. Vomiting with viral gastroenteritis is common, but it will usually go away with treatment. HOME CARE INSTRUCTIONS  Small amounts of fluids should be taken frequently. Large amounts at one time may not be tolerated. Plain water may be harmful in infants and the elderly. Oral rehydration solutions (ORS) are available at pharmacies and grocery  stores. ORS replace water and important electrolytes in proper proportions. Sports drinks are not as effective as ORS and may be harmful due to sugars worsening diarrhea.  As a general guideline for children, replace any new fluid losses from diarrhea or vomiting with ORS as follows:   If your child weighs 22 pounds or under (10 kg or less), give 60-120 mL (1/4 - 1/2 cup or 2 - 4 ounces) of ORS for each diarrheal stool or vomiting episode.   If your child weighs more than 22 pounds (more than 10 kgs), give 120-240 mL (1/2 - 1 cup or 4 - 8 ounces) of ORS for each diarrheal stool or vomiting episode.   In a child with vomiting, it may be helpful to give the above ORS replacement in 5 mL (1 teaspoon) amounts every 5 minutes, then increase as tolerated.   While correcting for dehydration, children should eat normally. However, foods high in sugar should be avoided because this may worsen diarrhea. Large amounts of carbonated soft drinks, juice, gelatin desserts, and other highly sugared drinks should be avoided.   After correction of dehydration, other liquids that are appealing to the child may be added. Children should drink small amounts of fluids frequently and fluids should be increased as tolerated.   Adults should eat normally while drinking more fluids than usual. Drink small amounts of fluids frequently and increase as tolerated. Drink enough water and fluids to keep your urine clear or pale yellow. Broths, weak decaffeinated tea, lemon-lime soft drinks (allowed to go flat), and ORS replace fluids and electrolytes.   Avoid:   Carbonated drinks.   Juice.  Extremely hot or cold fluids.   Caffeine drinks.   Fatty, greasy foods.   Alcohol.   Tobacco.   Too much intake of anything at one time.   Gelatin desserts.   Probiotics are active cultures of beneficial bacteria. They may lessen the amount and number of diarrheal stools in adults. Probiotics can be found in yogurt with  active cultures and in supplements.   Wash your hands well to avoid spreading bacteria and viruses.   Antidiarrheal medicines are not recommended for infants and children.   Only take over-the-counter or prescription medicines for pain, discomfort, or fever as directed by your caregiver. Do not give aspirin to children.   For adults with dehydration, ask your caregiver if you should continue all prescribed and over-the-counter medicines.   If your caregiver has given you a follow-up appointment, it is very important to keep that appointment. Not keeping the appointment could result in a lasting (chronic) or permanent injury and disability. If there is any problem keeping the appointment, you must call to reschedule.  SEEK IMMEDIATE MEDICAL CARE IF:   You are unable to keep fluids down.   There is no urine output in 6 to 8 hours or there is only a small amount of very dark urine.   You develop shortness of breath.   There is blood in the vomit (may look like coffee grounds) or stool.   Belly (abdominal) pain develops, increases, or localizes.   There is persistent vomiting or diarrhea.   You have a fever.   Your baby is older than 3 months with a rectal temperature of 102 F (38.9 C) or higher.   Your baby is 76 months old or younger with a rectal temperature of 100.4 F (38 C) or higher.  MAKE SURE YOU:   Understand these instructions.   Will watch your condition.   Will get help right away if you are not doing well or get worse.  Document Released: 07/05/2005 Document Revised: 03/17/2011 Document Reviewed: 11/16/2006 [facility] Patient Information 2012 [place], [place].

## 2012-08-07 NOTE — Addendum Note (Signed)
Addended by: Jac Canavan on: 08/07/2012 10:18 AM   Modules accepted: Orders

## 2012-08-07 NOTE — Progress Notes (Signed)
Subjective:  Regina Stevens is a 32 y.o. female who presents for 1.5 day hx/o nausea, abdominal pain, then started vomiting, had headache then shortly after this started having diarrhea.  In last 24 hours about 6 diarrhea stools.  No blood or mucous, but very watery stool.   No sick contacts with similar.  She has visited a family member in the hospital recently though.  She still notes abdominal pain, mostly central upper and lower, radiates to the back left.  Very uncomfortable.  still having vomiting, can't keep anything down.   Husband brought her today.   She did use some Tylenol multi symptom, but vomited this back up.  No other aggravating or relieving factors.    LMP 07/14/12.  No other c/o.  The following portions of the patient's history were reviewed and updated as appropriate: allergies, current medications, past family history, past medical history, past social history, past surgical history and problem list.  Allergies  Allergen Reactions  . Benadryl (Diphenhydramine Hcl) Other (See Comments)    Swelling, hives  . Morphine And Related Hives  . Shellfish Allergy Hives  . Iodine Rash    Current Outpatient Prescriptions on File Prior to Visit  Medication Sig Dispense Refill  . albuterol (PROVENTIL HFA;VENTOLIN HFA) 108 (90 BASE) MCG/ACT inhaler 2 puffs q6 hours as needed for wheezing  1 Inhaler  3  . fexofenadine (ALLEGRA) 30 MG tablet Take 30 mg by mouth daily.       . fluticasone (FLONASE) 50 MCG/ACT nasal spray INSTILL 2 SPRAYS IN EACH NOSTRIL ONCE DAILY  16 g  1  . fluticasone-salmeterol (ADVAIR HFA) 115-21 MCG/ACT inhaler Inhale 2 puffs into the lungs daily.        . montelukast (SINGULAIR) 10 MG tablet TAKE ONE TABLET BY MOUTH DAILY  30 tablet  1  . mometasone (NASONEX) 50 MCG/ACT nasal spray Place 2 sprays into the nose daily.  17 g  0  . permethrin (ELIMITE) 5 % cream Apply topically once.  60 g  0    Past Medical History  Diagnosis Date  . Asthma   . Allergy    . Spontaneous abortion 2006  . Wears glasses     Past Surgical History  Procedure Date  . Cholecystectomy     Family History  Problem Relation Age of Onset  . Hypertension Mother   . Angina Mother   . Cancer Paternal Uncle   . Heart disease Maternal Grandmother   . Cancer Maternal Grandmother   . Stroke Neg Hx   . Diabetes Neg Hx   . COPD Neg Hx   . Hyperlipidemia Neg Hx   . Kidney disease Neg Hx   . Asthma Other     History   Social History  . Marital Status: Married    Spouse Name: N/A    Number of Children: N/A  . Years of Education: N/A   Occupational History  . unemployed    Social History Main Topics  . Smoking status: Never Smoker   . Smokeless tobacco: Never Used  . Alcohol Use: No  . Drug Use: No  . Sexually Active: Not on file   Other Topics Concern  . Not on file   Social History Narrative   Exercise - walks 2 miles daily; No children    Review of Systems Constitutional: -fever, +chills, +sweats ENT: -runny nose, -ear pain, -sore throat Cardiology:  -chest pain, -palpitations, -edema Respiratory: +cough, -shortness of breath, -wheezing Musculoskeletal: -arthralgias, -myalgias, -joint swelling, +  back pain Ophthalmology: -vision changes Urology: -dysuria, -difficulty urinating, -hematuria, -urinary frequency, -urgency Gyn: negative Neurology: +headache, -weakness, -tingling, -numbness Otherwise as in subjective above    Objective:  Vital signs reviewed  General appearance: alert, WD/WN, ill appearing, in pain HEENT: normocephalic, sclerae anicteric, conjunctiva pink and moist, TMs pearly, nares patent, no discharge or erythema, pharynx normal Oral cavity: MMM, no lesions Neck: supple, no lymphadenopathy, no thyromegaly, no masses Heart: RRR, normal S1, S2, no murmurs Lungs: CTA bilaterally, no wheezes, rhonchi, or rales Abdomen: +decreased bs, soft, generalized tenderness, worse suprapubic, epigastric, non distended, no masses, no  hepatomegaly, no splenomegaly Pulses: 2+ radial pulses, 2+ pedal pulses, normal cap refill Ext: no edema   Assessment: Encounter Diagnoses  Name Primary?  . Abdominal pain Yes  . Nausea & vomiting   . Diarrhea     Plan: Will send for stat labs.  Urinalysis with small protein, otherwise unremarkable, Upreg negative.     discussed differential for symptoms, but viral gastroenteritis seems most likely.   discussed treatment, precautions, usual course of illness, and to recheck if not improving in 3-4 days or if worsening.  Gave Phenergan 25mg  IM in office.   Husband will take her home and look after her today.   Handout given.  Follow up pending labs

## 2012-11-06 ENCOUNTER — Ambulatory Visit (INDEPENDENT_AMBULATORY_CARE_PROVIDER_SITE_OTHER): Payer: BC Managed Care – PPO | Admitting: Medical

## 2012-11-06 ENCOUNTER — Encounter: Payer: Self-pay | Admitting: Medical

## 2012-11-06 VITALS — BP 120/80 | HR 62 | Temp 98.3°F | Resp 16 | Wt 153.0 lb

## 2012-11-06 DIAGNOSIS — J309 Allergic rhinitis, unspecified: Secondary | ICD-10-CM

## 2012-11-06 DIAGNOSIS — J302 Other seasonal allergic rhinitis: Secondary | ICD-10-CM

## 2012-11-06 DIAGNOSIS — J4520 Mild intermittent asthma, uncomplicated: Secondary | ICD-10-CM

## 2012-11-06 DIAGNOSIS — J45909 Unspecified asthma, uncomplicated: Secondary | ICD-10-CM

## 2012-11-06 MED ORDER — FLUTICASONE PROPIONATE 50 MCG/ACT NA SUSP: 2.0000 | Freq: Every day | NASAL | Status: DC

## 2012-11-06 MED ORDER — ALBUTEROL SULFATE (2.5 MG/3ML) 0.083% IN NEBU: 2.5000 mg | INHALATION_SOLUTION | Freq: Four times a day (QID) | RESPIRATORY_TRACT | Status: AC | PRN

## 2012-11-06 MED ORDER — FLUTICASONE-SALMETEROL 100-50 MCG/DOSE IN AEPB: 1.0000 | INHALATION_SPRAY | Freq: Two times a day (BID) | RESPIRATORY_TRACT | Status: DC

## 2012-11-06 MED ORDER — FLUTICASONE PROPIONATE 50 MCG/ACT NA SUSP: 1.0000 | Freq: Every day | NASAL | Status: DC

## 2012-11-06 MED ORDER — ALBUTEROL SULFATE HFA 108 (90 BASE) MCG/ACT IN AERS: INHALATION_SPRAY | RESPIRATORY_TRACT | Status: DC

## 2012-11-06 MED ORDER — MONTELUKAST SODIUM 10 MG PO TABS: 10.0000 mg | ORAL_TABLET | Freq: Every day | ORAL | Status: DC

## 2012-11-06 NOTE — Progress Notes (Signed)
Subjective:  Regina Stevens is a 32 y.o. female who presents with asthma concerns.  Had bad flare this weekend, felt SOB Friday.  Was at work when symptoms flared up.  Thinks its related to her allergeies (environmental).  Started with coughing spells, and as the day went on had wheezing and SOB.   Used albuterol inhaler several times.  At home realized she didn't have any albuterol nebulizer solution.  Thus, she came in.  Symptoms eased up yesterday.    Allergy regimen - using Singulair daily.  Out of nasal sprays.  Using Allegra OTC.  Asthma - Advair purple disk, 1 inhalation twice daily.   Albuterol HFA for as needed.  Prior to this weekend, hasn't been having to albuterol but about once per week.    Needs refills on medication.  No other aggravating or relieving factors.    No other c/o.  The following portions of the patient's history were reviewed and updated as appropriate: allergies, current medications, past family history, past medical history, past social history, past surgical history and problem list.  ROS Otherwise as in subjective above  Objective: Physical Exam  Vital signs reviewed  General appearence: alert, no distress, WD/WN HEENT: normocephalic, sclerae anicteric, conjunctiva pink and moist, TMs pearly, nares with swollen boggy turbinates, clear discharge, no erythema, pharynx normal Oral cavity: MMM, no lesions Neck: supple, no lymphadenopathy, no thyromegaly, no masses Heart: RRR, normal S1, S2, no murmurs Lungs: CTA bilaterally, no wheezes, rhonchi, or rales Pulses: 2+ radial pulses, 2+ pedal pulses, normal cap refill Ext: no edema   Assessment: Encounter Diagnoses  Name Primary?  Marland Kitchen Asthma, mild intermittent Yes  . Seasonal allergic rhinitis      Plan: Has been controlled pretty well until she ran out of her medication.  Refills today.  Patient Instructions  Allergies: Continue Singulair and Allegra daily Continue Flonase nasal spray, 2  sprays per nostril daily.   Asthma -  Continue Advair 1 inhalation twice daily - this is for PREVENTION, this is plan A. Continue to rinse mouth out with water after use.   When you feel wheezy, short of breath, then use the Albuterol handheld HFA/puffer as needed every 4-6 hours - this is plan B  When this still isn't working, then go to the nebulizer.  This is Plan C.    If this is not doing well, then come in.  If this regimen isn't working, or if you continue to have to use the Albuterol more then 2-3 times every week, more than 1 x per week at night, then return to discuss changes in your medication.     Follow up: 2-3 wk if not improving.

## 2012-11-06 NOTE — Patient Instructions (Signed)
Allergies: Continue Singulair and Allegra daily Continue Flonase nasal spray, 2 sprays per nostril daily.   Asthma -  Continue Advair 1 inhalation twice daily - this is for PREVENTION, this is plan A. Continue to rinse mouth out with water after use.   When you feel wheezy, short of breath, then use the Albuterol handheld HFA/puffer as needed every 4-6 hours - this is plan B  When this still isn't working, then go to the nebulizer.  This is Plan C.    If this is not doing well, then come in.  If this regimen isn't working, or if you continue to have to use the Albuterol more then 2-3 times every week, more than 1 x per week at night, then return to discuss changes in your medication.

## 2013-01-29 ENCOUNTER — Ambulatory Visit (INDEPENDENT_AMBULATORY_CARE_PROVIDER_SITE_OTHER): Payer: BC Managed Care – PPO | Admitting: Medical

## 2013-01-29 ENCOUNTER — Encounter: Payer: Self-pay | Admitting: Medical

## 2013-01-29 VITALS — BP 110/80 | HR 62 | Temp 98.1°F | Resp 16 | Wt 153.0 lb

## 2013-01-29 DIAGNOSIS — M25559 Pain in unspecified hip: Secondary | ICD-10-CM

## 2013-01-29 DIAGNOSIS — M25551 Pain in right hip: Secondary | ICD-10-CM

## 2013-01-29 DIAGNOSIS — R109 Unspecified abdominal pain: Secondary | ICD-10-CM

## 2013-01-29 NOTE — Progress Notes (Addendum)
Subjective: Here for c/o pain in right lower abdomen/hip.  Started 3 days ago tender point area, but worsened to sharp stabbing pain, no radiation of pain, worse with lifting right thigh.  Denies injury, trauma, fall.  She has hx/o PCOS, but denies GU c/o, no gyn c/o, no GI c/o, no NVD, no constipation, no fever, no chills, no bleeding.  Only other symptoms is bitemporal headache today, mild to moderate.  Pain has been up to 7/10.  Pain seems constant.   Took Advil with no relief of hip pain, but headache improved.  Sees gynecology, last ultrasound 05/2012.   Past Medical History  Diagnosis Date  . Asthma   . Allergy   . Spontaneous abortion 2006  . Wears glasses    ROS as in subjective  Objective: Filed Vitals:   01/29/13 1446  BP: 110/80  Pulse: 62  Temp: 98.1 F (36.7 C)  Resp: 16    General appearance: alert, no distress, WD/WN MSK: point tenderness moderate over right anterior superior iliac spine, mild pain with resisted hip flexion, otherwise LE unremarkable Back: nontender Abdomen: +bs, soft, RUQ surgical scars from lap/choly, mild generalized lower abdominal tenderness bilat and suprapubic, otherwise non tender, non distended, no masses, no hepatomegaly, no splenomegaly Pulses: 2+ symmetric Ext: no edema  Assessment: Encounter Diagnoses  Name Primary?  . Abdominal pain Yes  . Hip pain, acute, right      Plan: Urinalysis unremarkable, urine pregnancy negative.  discussed possible causes of point tender hip/iliac crest, but also possible causes of pelvic/lower abdominal pain.  etiology today not clear.  No obvious case for appendicitis.  She declines gyn/rectal exam today.    For now, gave Aleve samples to take BID, apply heat to the area.   If worse or not improving, then return.  Call back latera this week with symptom update.  If signs of appendicitis as we discussed, return immediately or go to the ED.

## 2013-01-30 LAB — POCT URINALYSIS DIPSTICK
Blood, UA: NEGATIVE
Glucose, UA: NEGATIVE
Ketones, UA: NEGATIVE
Spec Grav, UA: <=1.005
Urobilinogen, UA: NEGATIVE

## 2013-01-30 LAB — POCT URINE PREGNANCY: Preg Test, Ur: NEGATIVE

## 2013-04-18 DIAGNOSIS — Z2821 Immunization not carried out because of patient refusal: Secondary | ICD-10-CM

## 2013-04-18 HISTORY — DX: Immunization not carried out because of patient refusal: Z28.21

## 2013-04-25 ENCOUNTER — Ambulatory Visit (INDEPENDENT_AMBULATORY_CARE_PROVIDER_SITE_OTHER): Payer: BC Managed Care – PPO | Admitting: Medical

## 2013-04-25 ENCOUNTER — Encounter: Payer: Self-pay | Admitting: Medical

## 2013-04-25 VITALS — BP 120/82 | HR 88 | Temp 98.2°F | Resp 16 | Ht 59.0 in | Wt 150.0 lb

## 2013-04-25 DIAGNOSIS — L259 Unspecified contact dermatitis, unspecified cause: Secondary | ICD-10-CM

## 2013-04-25 DIAGNOSIS — E282 Polycystic ovarian syndrome: Secondary | ICD-10-CM

## 2013-04-25 DIAGNOSIS — J45909 Unspecified asthma, uncomplicated: Secondary | ICD-10-CM

## 2013-04-25 DIAGNOSIS — R809 Proteinuria, unspecified: Secondary | ICD-10-CM

## 2013-04-25 DIAGNOSIS — L309 Dermatitis, unspecified: Secondary | ICD-10-CM

## 2013-04-25 DIAGNOSIS — Z0000 Encounter for general adult medical examination without abnormal findings: Secondary | ICD-10-CM

## 2013-04-25 LAB — BASIC METABOLIC PANEL WITH GFR
BUN: 6 mg/dL (ref 6–23)
CO2: 26 meq/L (ref 19–32)
Calcium: 9.5 mg/dL (ref 8.4–10.5)
Chloride: 104 meq/L (ref 96–112)
Creat: 0.62 mg/dL (ref 0.50–1.10)
Glucose, Bld: 75 mg/dL (ref 70–99)
Potassium: 3.9 meq/L (ref 3.5–5.3)
Sodium: 138 meq/L (ref 135–145)

## 2013-04-25 LAB — POCT URINALYSIS DIPSTICK
Glucose, UA: NEGATIVE
Ketones, UA: NEGATIVE
Leukocytes, UA: NEGATIVE
Nitrite, UA: NEGATIVE

## 2013-04-25 LAB — HEMOGLOBIN A1C
Hgb A1c MFr Bld: 5.7 % — ABNORMAL HIGH
Mean Plasma Glucose: 117 mg/dL — ABNORMAL HIGH

## 2013-04-25 LAB — LIPID PANEL
HDL: 44 mg/dL (ref >39)
LDL Cholesterol: 86 mg/dL (ref 0–99)
Triglycerides: 56 mg/dL (ref <150)
VLDL: 11 mg/dL (ref 0–40)

## 2013-04-25 MED ORDER — FLUTICASONE PROPIONATE 50 MCG/ACT NA SUSP: 2.0000 | Freq: Every day | NASAL | Status: DC

## 2013-04-25 MED ORDER — ALBUTEROL SULFATE HFA 108 (90 BASE) MCG/ACT IN AERS: INHALATION_SPRAY | RESPIRATORY_TRACT | Status: DC

## 2013-04-25 NOTE — Progress Notes (Signed)
Subjective:   HPI  Regina Stevens is a 32 y.o. female who presents for a complete physical.     Preventative care: Last ophthalmology visit: yes [facility] Last dental visit: yes Last colonoscopy:n/a Last mammogram:n/a Last gynecological exam:04/2012 Last EKG:n/a Last labs: 2014  Prior vaccinations: TD or Tdap:? 2 years ago Influenza:no flu shot Pneumococcal:n/a Shingles/Zostavax:n/a  Advanced directive:n/a Health care power of attorney:n/a Living will:n/a  Concerns: Asthma - doing fine on current regimen.  No c/o.   Using Advair BID, uses albuterol about 1 x per week, worse at work around dust.    Eczema - lately worse.  Worse on face, neck.  Uses Hydrocortisone cream OTC.  Eucerin cream daily.    PCOS - was on Metformin prior, then they switched to OCPs.  Currently on nothing as it is not improving. 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     Reviewed their medical, surgical, family, social, medication, and allergy history and updated chart as appropriate.  Past Medical History  Diagnosis Date  . Allergy   . Spontaneous abortion 2006  . Wears glasses   . Asthma     2006 hospitalization  . Dental bridge present   . Atopic dermatitis   . PCOS (polycystic ovarian syndrome)   . Routine gynecological examination 10/13    Dr. Corky Sox    Past Surgical History  Procedure Laterality Date  . Cholecystectomy    . Wisdom tooth extraction      History   Social History  . Marital Status: Married    Spouse Name: N/A    Number of Children: N/A  . Years of Education: N/A   Occupational History  . unemployed    Social History Main Topics  . Smoking status: Never Smoker   . Smokeless tobacco: Never Used  . Alcohol Use: 1.2 oz/week    2 Glasses of wine per week  . Drug Use: No  . Sexual Activity: Not on file   Other Topics Concern  . Not on file   Social History Narrative   Exercise - walks 2 miles daily; No children, Married.   Teller at [employer]    Family History  Problem Relation Age of Onset  . Hypertension Mother   . Angina Mother   . Cancer Paternal Uncle   . Heart disease Maternal Grandmother   . Cancer Maternal Grandmother   . Stroke Neg Hx   . Diabetes Neg Hx   . COPD Neg Hx   . Hyperlipidemia Neg Hx   . Kidney disease Neg Hx   . Asthma Other     Current outpatient prescriptions:albuterol (PROVENTIL HFA;VENTOLIN HFA) 108 (90 BASE) MCG/ACT inhaler, 2 puffs q6 hours as needed for wheezing, Disp: 1 Inhaler, Rfl: 3;  albuterol (PROVENTIL) (2.5 MG/3ML) 0.083% nebulizer solution, Take 3 mLs (2.5 mg total) by nebulization every 6 (six) hours as needed for wheezing., Disp: 150 mL, Rfl: 2;  fexofenadine (ALLEGRA) 30 MG tablet, Take 30 mg by mouth daily. , Disp: , Rfl:  fluticasone (FLONASE) 50 MCG/ACT nasal spray, Place 2 sprays into the nose daily., Disp: 16 g, Rfl: 11;  Fluticasone-Salmeterol (ADVAIR) 100-50 MCG/DOSE AEPB, Inhale 1 puff into the lungs 2 (two) times daily., Disp: 60 each, Rfl: 5;  montelukast (SINGULAIR) 10 MG tablet, Take 1 tablet (10 mg total) by mouth at bedtime., Disp: 90 tablet, Rfl: 3  Allergies  Allergen Reactions  . Benadryl [Diphenhydramine Hcl] Other (See Comments)    Swelling, hives  .  Morphine And Related Hives  . Shellfish Allergy Hives  . Iodine Rash   Review of Systems Constitutional: -fever, -chills, -sweats, -unexpected weight change, -decreased appetite, -fatigue Allergy: -sneezing, -itching, -congestion Dermatology: -changing moles, --rash, -lumps ENT: -runny nose, -ear pain, -sore throat, -hoarseness, -sinus pain, -teeth pain, - ringing in ears, -hearing loss, -nosebleeds Cardiology: -chest pain, -palpitations, -swelling, -difficulty breathing when lying flat, -waking up short of breath Respiratory: -cough, -shortness of breath, -difficulty breathing with exercise or exertion, -wheezing, -coughing up blood Gastroenterology: -abdominal pain, -nausea, -vomiting,  -diarrhea, -constipation, -blood in stool, -changes in bowel movement, -difficulty swallowing or eating Hematology: -bleeding, -bruising  Musculoskeletal: -joint aches, -muscle aches, -joint swelling, -back pain, -neck pain, -cramping, -changes in gait Ophthalmology: denies vision changes, eye redness, itching, discharge Urology: -burning with urination, -difficulty urinating, -blood in urine, -urinary frequency, -urgency, -incontinence Neurology: -headache, -weakness, -tingling, -numbness, -memory loss, -falls, -dizziness Psychology: -depressed mood, -agitation, -sleep problems     Objective:   Physical Exam  BP 120/82  Pulse 88  Temp(Src) 98.2 F (36.8 C) (Oral)  Resp 16  Ht 4\' 11"  (1.499 m)  Wt 150 lb (68.04 kg)  BMI 30.28 kg/m2  LMP 01/03/2013  General appearance: alert, no distress, WD/WN, black female, looks stated age, overweight  Skin: unremarkable, few scattered benign appearing macules , some very mild rough patches of skin c/w eczema along posterior neck, bilat lateral antecubital regions HEENT: normocephalic, conjunctiva/corneas normal, sclerae anicteric, PERRLA, EOMi, nares with clear discharge, right turbinate swollen and pale, not patent, pharynx normal  Oral cavity: MMM, tongue normal, few bottom teeth with decay, upper denture Neck: supple, no lymphadenopathy, no thyromegaly, no masses, normal ROM, no bruits  Chest: non tender, normal shape and expansion  Heart: RRR, normal S1, S2, no murmurs  Lungs: CTA bilaterally, no wheezes, rhonchi, or rales  Abdomen: +bs, soft, non tender, non distended, no masses, no hepatomegaly, no splenomegaly, no bruits  Back: non tender, normal ROM, no scoliosis  Musculoskeletal: upper extremities non tender, no obvious deformity, normal ROM throughout, lower extremities non tender, no obvious deformity, normal ROM throughout  Extremities: no edema, no cyanosis, no clubbing  Pulses: 2+ symmetric, upper and lower extremities, normal cap  refill  Neurological: alert, oriented x 3, CN2-12 intact, strength normal upper extremities and lower extremities, sensation normal throughout, DTRs 2+ throughout, no cerebellar signs, gait normal  Psychiatric: normal affect, behavior normal, pleasant  Breast/pelvic deferred to gyn   Assessment and Plan :    Encounter Diagnoses  Name Primary?  . Routine general medical examination at a health care facility Yes  . PCOS (polycystic ovarian syndrome)   . Unspecified asthma(493.90)   . Eczema   . Proteinuria     Physical exam - discussed healthy lifestyle, diet, exercise, preventative care, vaccinations, and addressed their concerns.  Handout given. See dentist and eye doctor yearly. See gynecology yearly.  Labs today.completed her foster parent physical form. PCOS - f/u with gynecology Asthma - controlled on current medications.   C/t current medications. Eczema - c/t Eucerin or consider other emollient or moisturizing lotion .  OTC hydrocortisone for flare. Proteinuria noticed some urine dipstick today Follow-up pending labs

## 2013-04-26 LAB — MICROALBUMIN / CREATININE URINE RATIO
Creatinine, Urine: 417.5 mg/dL
Microalb Creat Ratio: 9 mg/g (ref 0.0–30.0)
Microalb, Ur: 3.75 mg/dL — ABNORMAL HIGH (ref 0.00–1.89)

## 2013-05-01 ENCOUNTER — Telehealth: Payer: Self-pay | Admitting: Medical

## 2013-05-01 NOTE — Telephone Encounter (Signed)
lm

## 2013-05-24 ENCOUNTER — Other Ambulatory Visit: Payer: Self-pay

## 2013-05-29 ENCOUNTER — Other Ambulatory Visit: Payer: BC Managed Care – PPO

## 2013-11-21 ENCOUNTER — Other Ambulatory Visit: Payer: Self-pay | Admitting: Medical

## 2014-02-08 ENCOUNTER — Other Ambulatory Visit: Payer: Self-pay | Admitting: Obstetrics and Gynecology

## 2014-07-29 ENCOUNTER — Other Ambulatory Visit: Payer: Self-pay | Admitting: Medical

## 2014-09-26 ENCOUNTER — Encounter: Payer: Self-pay | Admitting: Family Medicine

## 2014-09-26 ENCOUNTER — Ambulatory Visit (INDEPENDENT_AMBULATORY_CARE_PROVIDER_SITE_OTHER): Payer: BLUE CROSS/BLUE SHIELD | Admitting: Family Medicine

## 2014-09-26 VITALS — BP 138/88 | HR 92 | Ht 60.0 in | Wt 162.6 lb

## 2014-09-26 DIAGNOSIS — M542 Cervicalgia: Secondary | ICD-10-CM | POA: Diagnosis not present

## 2014-09-26 DIAGNOSIS — M940 Chondrocostal junction syndrome [Tietze]: Secondary | ICD-10-CM

## 2014-09-26 DIAGNOSIS — M79601 Pain in right arm: Secondary | ICD-10-CM | POA: Diagnosis not present

## 2014-09-26 DIAGNOSIS — J069 Acute upper respiratory infection, unspecified: Secondary | ICD-10-CM

## 2014-09-26 MED ORDER — NAPROXEN 500 MG PO TABS: 500.0000 mg | ORAL_TABLET | Freq: Two times a day (BID) | ORAL | Status: DC

## 2014-09-26 NOTE — Patient Instructions (Signed)
  Drink plenty of fluids. You may continue your cold medications (the decongestant is likely why your blood pressure was slightly high--okay if only used short-term. Add guaifenesin (ie Mucinex) as an expectorant to help loosen up the phlegm and help with chest congestion. Call next week for an antibiotic if your mucus/phlegm remains discolored/yellow, or if you develop a fever, sinus pain.  For the neck/chest/arm pain--I feel this is due to inflammation (possibly inflamed nerve affecting the arm and muscle; inflammation of the cartilage where the rib meets the sternum in your chest). Take naproxen twice daily with food.  Take it regularly until your pain is completely resolved (at least a week, you may take it for the full two weeks if needed. Take your prescribed anti-inflammatory medication with food; discontinue or cut back the dose if you develop stomach pain/discomfort/side effects.  Do not take other over-the-counter pain medications such as ibuprofen, advil, motrin, aleve, naproxen at the same time.  Do not use longer than recommended.  It is okay to use acetaminophen (tylenol) along with this medication.

## 2014-09-26 NOTE — Progress Notes (Signed)
Chief Complaint  Patient presents with  . Chest Pain    right sided arm and back pain that started Tuesday, right sided chest pain started last night.    2 days ago she had a sharp pain starting in her right shoulder (in the front, and also back), that radiated down to her right hand--all fingers except not her thumb.  She described it as a pulling sensation in her arm.  Started suddenly, on a day off, had done some light cleaning, and pain began after sitting on the couch after putting dishes away.  She took three baby aspirin (concerned it could have been her heart).  Arm continued to hurt; she noticed her veins popping up in her hand, very visible,and raised.  No associated swelling in the arm.  She had also taken Aleve.  She doesn't recall the pain easing up at all prior to getting to bed.  Woke up the next morning and it was better.  Currently she still has a discomfort in her right forearm, still described as a "pulling", ache--nothing as bad as it was 2 nights ago.  Last night she started having some discomfort in her left chest.  She noticed it while driving, and she stretched her right arm back, which helped with the discomfort some.  By the time she got to her destination 30 minutes later, it was better.  Currently doesn't have any pain in the chest, but she has some pain in her upper back.  It is described as a sharp pain.  She has had a "really bad cold".  She is finally getting better, but still is coughing some.  She has been using inhaler about 4x/week since this illness.  Phlegm is light yellow.  She was sick all last week, now in the second week of illness.  2 days ago the mucus was a dark gold.  She still has a sore throat.  Denies nasal congestion during the day, just in the mornings, mucus is clear.  Denies any ear pain. She has been taking Nyquil at night, and Tylenol Cold during the day, which helps some.  PMH, PSH, SH reviewed and updated today  Outpatient Encounter Prescriptions  as of 09/26/2014  Medication Sig Note  . fexofenadine (ALLEGRA) 180 MG tablet Take 180 mg by mouth daily.   . fluticasone (FLONASE) 50 MCG/ACT nasal spray Place 2 sprays into the nose daily.   . Fluticasone-Salmeterol (ADVAIR) 100-50 MCG/DOSE AEPB Inhale 1 puff into the lungs 2 (two) times daily.   . metFORMIN (GLUCOPHAGE) 500 MG tablet Take 500 mg by mouth 2 (two) times daily with a meal. 09/26/2014: Received from: External Pharmacy  . montelukast (SINGULAIR) 10 MG tablet TAKE 1 TABLET BY MOUTH EVERY NIGHT AT BEDTIME   . Prenatal Vit-Fe Fumarate-FA (PRENATAL MULTIVITAMIN) TABS tablet Take 1 tablet by mouth daily at 12 noon.   Marland Kitchen VIORELE 0.15-0.02/0.01 MG (21/5) tablet Take 1 tablet by mouth daily. 09/26/2014: Received from: External Pharmacy  . [DISCONTINUED] albuterol (PROVENTIL HFA;VENTOLIN HFA) 108 (90 BASE) MCG/ACT inhaler  09/26/2014: Received from: [facility]  . [DISCONTINUED] fexofenadine (ALLEGRA) 180 MG tablet Take 180 mg by mouth. 09/26/2014: Received from: [facility]  . [DISCONTINUED] fexofenadine (ALLEGRA) 30 MG tablet Take 30 mg by mouth daily.    . [DISCONTINUED] fluticasone (FLONASE) 50 MCG/ACT nasal spray  09/26/2014: Received from: [facility]  . [DISCONTINUED] montelukast (SINGULAIR) 10 MG tablet  09/26/2014: Received from: [facility]  . [DISCONTINUED] progesterone (PROMETRIUM)  200 MG capsule Take 400 mg by mouth. 09/26/2014: [facility]  . albuterol (PROVENTIL HFA;VENTOLIN HFA) 108 (90 BASE) MCG/ACT inhaler 2 puffs q6 hours as needed for wheezing (Patient not taking: Reported on 09/26/2014) 09/26/2014: Using 4x/week recently, since having a cold  . albuterol (PROVENTIL) (2.5 MG/3ML) 0.083% nebulizer solution Take 3 mLs (2.5 mg total) by nebulization every 6 (six) hours as needed for wheezing. (Patient not taking: Reported on 09/26/2014)   . [DISCONTINUED] fluconazole (DIFLUCAN) 150 MG tablet  09/26/2014: Received from: External Pharmacy  . [DISCONTINUED]  fluticasone (FLONASE) 50 MCG/ACT nasal spray INSTILL 2 SPRAYS INTO EACH NOSTRIL EVERY DAY   . [DISCONTINUED] HYDROcodone-ibuprofen (VICOPROFEN) 7.5-200 MG per tablet  09/26/2014: Received from: External Pharmacy  . [DISCONTINUED] MICROGESTIN FE 1/20 1-20 MG-MCG tablet  09/26/2014: Received from: External Pharmacy  . [DISCONTINUED] progesterone 50 MG/ML injection Inject 50 mg into the muscle. 09/26/2014: [facility]   Allergies  Allergen Reactions  . Benadryl [Diphenhydramine Hcl] Other (See Comments)    Swelling, hives  . Morphine And Related Hives  . Shellfish Allergy Hives  . Iodine Rash   ROS:  No  Fevers, chills, headaches, dizziness, nausea, vomiting, bleeding/bruising, rash or urinary complaints.  +URI symptoms as per HPI, +chest and right arm pain per HPI.   PHYSICAL EXAM: BP 150/104 mmHg  Pulse 92  Ht 5' (1.524 m)  Wt 162 lb 9.6 oz (73.755 kg)  BMI 31.76 kg/m2  LMP 09/05/2014 138/88 on repeat by MD Well developed, pleasant female, with occasional deep/wet cough.  Speaking easily in full sentences, in no distress HEENT: PERRL, EOMi, conjunctiva clear. TM's and EAc's normal. Nasal mucosa is mildly edematous, with clear-white mucus and white crusting. Slight erythema of mucosa.  OP is clear. Sinuses are are nontender Neck: no lymphadenopathy, thyromegaly or mass. No carotid bruit. No c-spine tenderness.  She has some tenderness along paraspinous muscles in lower neck/upper thoracic, and tight trapezius muscles. Clavicle is nontender, AC joint nontender Chest: tender to palpation at right costochondral junctions, and this reproduces the pain she felt yesterday while driving. Heart: regular rate and rhythm without murmur Lungs: clear bilaterally. No wheezes, rales, ronchi.  Fair air movement. Neuro: alert and oriented.  Cranial nerves intact.  Strength, sensation and DTR's normal in RUE. Normal gait Extremities: no edema.  2+ radial pulse and brisk capillary refill on  the right UE. Skin: no rash  ASSESSMENT/PLAN:  Costochondritis - Plan: naproxen (NAPROSYN) 500 MG tablet  Neck pain  Right arm pain  Acute upper respiratory infection  Elevated BP--improved on recheck.  Decongestants contributing (and anxiety--better on recheck).  Musculoskeletal pain; component of muscle spasm, costochondritis.  Suspect possible pinched nerve.  Trial of NSAIDs. F/u next week if not better.  URI--improving, but still discolored mucus.  Add mucinex.  Continue albuterol prn. If not better by Monday (discolored mucus should be clear/white by then), then call for ABX.  Return if fevers, worsening shortness of breath, chest pain, or other new concerns.    Drink plenty of fluids. You may continue your cold medications (the decongestant is likely why your blood pressure was slightly high--okay if only used short-term. Add guaifenesin (ie Mucinex) as an expectorant to help loosen up the phlegm and help with chest congestion. Call next week for an antibiotic if your mucus/phlegm remains discolored/yellow, or if you develop a fever, sinus pain.  For the neck/chest/arm pain--I feel this is due to inflammation (possibly inflamed nerve affecting the arm and  muscle; inflammation of the cartilage where the rib meets the sternum in your chest). Take naproxen twice daily with food.  Take it regularly until your pain is completely resolved (at least a week, you may take it for the full two weeks if needed. Take your prescribed anti-inflammatory medication with food; discontinue or cut back the dose if you develop stomach pain/discomfort/side effects.  Do not take other over-the-counter pain medications such as ibuprofen, advil, motrin, aleve, naproxen at the same time.  Do not use longer than recommended.  It is okay to use acetaminophen (tylenol) along with this medication.  25 min visit, more than 1/2 spent counseling

## 2014-10-15 ENCOUNTER — Other Ambulatory Visit: Payer: Self-pay | Admitting: Medical

## 2014-10-15 ENCOUNTER — Telehealth: Payer: Self-pay | Admitting: Internal Medicine

## 2014-10-15 MED ORDER — MOMETASONE FUROATE 50 MCG/ACT NA SUSP: 2.0000 | Freq: Every day | NASAL | Status: DC

## 2014-10-15 NOTE — Telephone Encounter (Signed)
Pt states that her insurance will not cover generic brand name of flonase and will only cover generic name for nasonex and rhinocort. Please send in something else to pharmacy

## 2014-10-15 NOTE — Telephone Encounter (Signed)
Med sent, have pharmacist call us if issue

## 2014-11-12 ENCOUNTER — Other Ambulatory Visit: Payer: Self-pay | Admitting: Medical

## 2015-05-16 ENCOUNTER — Encounter: Payer: Self-pay | Admitting: Medical

## 2015-05-16 ENCOUNTER — Ambulatory Visit (INDEPENDENT_AMBULATORY_CARE_PROVIDER_SITE_OTHER): Payer: BLUE CROSS/BLUE SHIELD | Admitting: Medical

## 2015-05-16 VITALS — BP 150/90 | HR 85 | Temp 98.7°F | Resp 12 | Wt 164.0 lb

## 2015-05-16 DIAGNOSIS — J309 Allergic rhinitis, unspecified: Secondary | ICD-10-CM

## 2015-05-16 DIAGNOSIS — E282 Polycystic ovarian syndrome: Secondary | ICD-10-CM | POA: Diagnosis not present

## 2015-05-16 DIAGNOSIS — G4700 Insomnia, unspecified: Secondary | ICD-10-CM | POA: Diagnosis not present

## 2015-05-16 DIAGNOSIS — R030 Elevated blood-pressure reading, without diagnosis of hypertension: Secondary | ICD-10-CM

## 2015-05-16 DIAGNOSIS — G4489 Other headache syndrome: Secondary | ICD-10-CM | POA: Diagnosis not present

## 2015-05-16 DIAGNOSIS — J4540 Moderate persistent asthma, uncomplicated: Secondary | ICD-10-CM | POA: Diagnosis not present

## 2015-05-16 MED ORDER — HYDROCODONE-ACETAMINOPHEN 5-325 MG PO TABS: 1.0000 | ORAL_TABLET | Freq: Four times a day (QID) | ORAL | Status: DC | PRN

## 2015-05-16 NOTE — Patient Instructions (Signed)
Encounter Diagnoses  Name Primary?  . Headache syndrome Yes  . Allergic rhinitis, unspecified allergic rhinitis type   . Asthma, moderate persistent, uncomplicated   . Elevated blood-pressure reading without diagnosis of hypertension   . Insomnia   . PCOS (polycystic ovarian syndrome)    Recommendations:  Check blood pressure readings a few times per week.   If readings are staying above 120/80 regularly over the next few weeks, then we will need to consider medication  Eat healthy, exercise  Work on improving sleep  You can ask the fertility specialist about your medication safety and about a sleep aid if needed  I would stop Singulair for now given potential pregnancy risks  Consider mediterranean diet  Begin Hydrocodone today as needed every 4- 6 hours .  hopefully this will break the headache cycle        Why follow it? Research shows. . Those who follow the Mediterranean diet have a reduced risk of heart disease  . The diet is associated with a reduced incidence of Parkinson's and Alzheimer's diseases . People following the diet may have longer life expectancies and lower rates of chronic diseases  . The Dietary Guidelines for Americans recommends the Mediterranean diet as an eating plan to promote health and prevent disease  What Is the Mediterranean Diet?  . Healthy eating plan based on typical foods and recipes of Mediterranean-style cooking . The diet is primarily a plant based diet; these foods should make up a majority of meals   Starches - Plant based foods should make up a majority of meals - They are an important sources of vitamins, minerals, energy, antioxidants, and fiber - Choose whole grains, foods high in fiber and minimally processed items  - Typical grain sources include wheat, oats, barley, corn, brown rice, bulgar, farro, millet, polenta, couscous  - Various types of beans include chickpeas, lentils, fava beans, black beans, white beans   Fruits   Veggies - Large quantities of antioxidant rich fruits & veggies; 6 or more servings  - Vegetables can be eaten raw or lightly drizzled with oil and cooked  - Vegetables common to the traditional Mediterranean Diet include: artichokes, arugula, beets, broccoli, brussel sprouts, cabbage, carrots, celery, collard greens, cucumbers, eggplant, kale, leeks, lemons, lettuce, mushrooms, okra, onions, peas, peppers, potatoes, pumpkin, radishes, rutabaga, shallots, spinach, sweet potatoes, turnips, zucchini - Fruits common to the Mediterranean Diet include: apples, apricots, avocados, cherries, clementines, dates, figs, grapefruits, grapes, melons, nectarines, oranges, peaches, pears, pomegranates, strawberries, tangerines  Fats - Replace butter and margarine with healthy oils, such as olive oil, canola oil, and tahini  - Limit nuts to no more than a handful a day  - Nuts include walnuts, almonds, pecans, pistachios, pine nuts  - Limit or avoid candied, honey roasted or heavily salted nuts - Olives are central to the [employer] - can be eaten whole or used in a variety of dishes   Meats Protein - Limiting red meat: no more than a few times a month - When eating red meat: choose lean cuts and keep the portion to the size of deck of cards - Eggs: approx. 0 to 4 times a week  - Fish and lean poultry: at least 2 a week  - Healthy protein sources include, chicken, [place], lean beef, lamb - Increase intake of seafood such as tuna, salmon, trout, mackerel, shrimp, scallops - Avoid or limit high fat processed meats such as sausage and bacon  Dairy - Include moderate amounts of  low fat dairy products  - Focus on healthy dairy such as fat free yogurt, skim milk, low or reduced fat cheese - Limit dairy products higher in fat such as whole or 2% milk, cheese, ice cream  Alcohol - Moderate amounts of red wine is ok  - No more than 5 oz daily for women (all ages) and men older than age 46  - No more than 10 oz  of wine daily for men younger than 65  Other - Limit sweets and other desserts  - Use herbs and spices instead of salt to flavor foods  - Herbs and spices common to the traditional Mediterranean Diet include: basil, bay leaves, chives, cloves, cumin, fennel, garlic, lavender, marjoram, mint, oregano, parsley, pepper, rosemary, sage, savory, sumac, tarragon, thyme   It's not just a diet, it's a lifestyle:  . The Mediterranean diet includes lifestyle factors typical of those in the region  . Foods, drinks and meals are best eaten with others and savored . Daily physical activity is important for overall good health . This could be strenuous exercise like running and aerobics . This could also be more leisurely activities such as walking, housework, yard-work, or taking the stairs . Moderation is the key; a balanced and healthy diet accommodates most foods and drinks . Consider portion sizes and frequency of consumption of certain foods   Meal Ideas & Options:  . Breakfast:  o Whole wheat toast or whole wheat English muffins with peanut butter & hard boiled egg o Steel cut oats topped with apples & cinnamon and skim milk  o Fresh fruit: banana, strawberries, melon, berries, peaches  o Smoothies: strawberries, bananas, greek yogurt, peanut butter o Low fat greek yogurt with blueberries and granola  o Egg white omelet with spinach and mushrooms o Breakfast couscous: whole wheat couscous, apricots, skim milk, cranberries  . Sandwiches:  o Hummus and grilled vegetables (peppers, zucchini, squash) on whole wheat bread   o Grilled chicken on whole wheat pita with lettuce, tomatoes, cucumbers or tzatziki  o Tuna salad on whole wheat bread: tuna salad made with greek yogurt, olives, red peppers, capers, green onions o Garlic rosemary lamb pita: lamb sauted with garlic, rosemary, salt & pepper; add lettuce, cucumber, greek yogurt to pita - flavor with lemon juice and black pepper  . Seafood:   o Mediterranean grilled salmon, seasoned with garlic, basil, parsley, lemon juice and black pepper o Shrimp, lemon, and spinach whole-grain pasta salad made with low fat greek yogurt  o Seared scallops with lemon orzo  o Seared tuna steaks seasoned salt, pepper, coriander topped with tomato mixture of olives, tomatoes, olive oil, minced garlic, parsley, green onions and cappers  . Meats:  o Herbed greek chicken salad with kalamata olives, cucumber, feta  o Red bell peppers stuffed with spinach, bulgur, lean ground beef (or lentils) & topped with feta   o Kebabs: skewers of chicken, tomatoes, onions, zucchini, squash  o [place] burgers: made with red onions, mint, dill, lemon juice, feta cheese topped with roasted red peppers . Vegetarian o Cucumber salad: cucumbers, artichoke hearts, celery, red onion, feta cheese, tossed in olive oil & lemon juice  o Hummus and whole grain pita points with a greek salad (lettuce, tomato, feta, olives, cucumbers, red onion) o Lentil soup with celery, carrots made with vegetable broth, garlic, salt and pepper  o Tabouli salad: parsley, bulgur, mint, scallions, cucumbers, tomato, radishes, lemon juice, olive oil, salt and pepper.  Insomnia Insomnia is a sleep disorder that makes it difficult to fall asleep or to stay asleep. Insomnia can cause tiredness (fatigue), low energy, difficulty concentrating, mood swings, and poor performance at work or school.  There are three different ways to classify insomnia:  Difficulty falling asleep.  Difficulty staying asleep.  Waking up too early in the morning. Any type of insomnia can be long-term (chronic) or short-term (acute). Both are common. Short-term insomnia usually lasts for three months or less. Chronic insomnia occurs at least three times a week for longer than three months. CAUSES  Insomnia may be caused by another condition, situation, or substance, such as:  Anxiety.  Certain  medicines.  Gastroesophageal reflux disease (GERD) or other gastrointestinal conditions.  Asthma or other breathing conditions.  Restless legs syndrome, sleep apnea, or other sleep disorders.  Chronic pain.  Menopause. This may include hot flashes.  Stroke.  Abuse of alcohol, tobacco, or illegal drugs.  Depression.  Caffeine.   Neurological disorders, such as Alzheimer disease.  An overactive thyroid (hyperthyroidism). The cause of insomnia may not be known. RISK FACTORS Risk factors for insomnia include:  Gender. Women are more commonly affected than men.  Age. Insomnia is more common as you get older.  Stress. This may involve your professional or personal life.  Income. Insomnia is more common in people with lower income.  Lack of exercise.   Irregular work schedule or night shifts.  Traveling between different time zones. SIGNS AND SYMPTOMS If you have insomnia, trouble falling asleep or trouble staying asleep is the main symptom. This may lead to other symptoms, such as:  Feeling fatigued.  Feeling nervous about going to sleep.  Not feeling rested in the morning.  Having trouble concentrating.  Feeling irritable, anxious, or depressed. TREATMENT  Treatment for insomnia depends on the cause. If your insomnia is caused by an underlying condition, treatment will focus on addressing the condition. Treatment may also include:   Medicines to help you sleep.  Counseling or therapy.  Lifestyle adjustments. HOME CARE INSTRUCTIONS   Take medicines only as directed by your health care provider.  Keep regular sleeping and waking hours. Avoid naps.  Keep a sleep diary to help you and your health care provider figure out what could be causing your insomnia. Include:   When you sleep.  When you wake up during the night.  How well you sleep.   How rested you feel the next day.  Any side effects of medicines you are taking.  What you eat and  drink.   Make your bedroom a comfortable place where it is easy to fall asleep:  Put up shades or special blackout curtains to block light from outside.  Use a white noise machine to block noise.  Keep the temperature cool.   Exercise regularly as directed by your health care provider. Avoid exercising right before bedtime.  Use relaxation techniques to manage stress. Ask your health care provider to suggest some techniques that may work well for you. These may include:  Breathing exercises.  Routines to release muscle tension.  Visualizing peaceful scenes.  Cut back on alcohol, caffeinated beverages, and cigarettes, especially close to bedtime. These can disrupt your sleep.  Do not overeat or eat spicy foods right before bedtime. This can lead to digestive discomfort that can make it hard for you to sleep.  Limit screen use before bedtime. This includes:  Watching TV.  Using your smartphone, tablet, and computer.  Stick to a routine.  This can help you fall asleep faster. Try to do a quiet activity, brush your teeth, and go to bed at the same time each night.  Get out of bed if you are still awake after 15 minutes of trying to sleep. Keep the lights down, but try reading or doing a quiet activity. When you feel sleepy, go back to bed.  Make sure that you drive carefully. Avoid driving if you feel very sleepy.  Keep all follow-up appointments as directed by your health care provider. This is important. SEEK MEDICAL CARE IF:   You are tired throughout the day or have trouble in your daily routine due to sleepiness.  You continue to have sleep problems or your sleep problems get worse. SEEK IMMEDIATE MEDICAL CARE IF:   You have serious thoughts about hurting yourself or someone else.   This information is not intended to replace advice given to you by your health care provider. Make sure you discuss any questions you have with your health care provider.   Document  Released: 07/02/2000 Document Revised: 03/26/2015 Document Reviewed: 04/05/2014 Elsevier Interactive Patient Education [employer]

## 2015-05-16 NOTE — Progress Notes (Signed)
Subjective: Chief Complaint  Patient presents with  . Headache    started a week ago. been consistent. normally gets random headaches but never any that wouldnt go away. aleve would help but after it wore off the headache came back,     Regina Stevens is a 34 y.o. female who presents for evaluation of headache.  In the last month has had some headaches, but worse this past week.  Has had a headache for the past week and a half straight.   Awoke from headache yesterday.  Started in temples, but yesterday was behind left ear with pressure.  generally ibuprofen helps, but not currently.   Prior to this past week was getting headache once a week, generally bilateral temples, sometimes eye ache with the headaches.   Having some neck tension.  Denies nausea.  She does report photophobia but no phonophobia.  Had a weird taste yesterday, but denies any other aura.  Generally with the headaches she would not have neck tension but she has had this over the past week.   Wears glasses, prescription out of date, squinting a lot.   Stress of all this is possible giving her headaches.  Had to abruptly stop birth control 05/04/15 having problems with PCOS, starting another round of IVF.  She does note stopping caffeine 05/04/15.  weight is up and down.  Asthma flared 3 days ago, but this resolved.   On her daily allergy medication.   No numbness, tingling weakness.  Has checked blood pressures some since having headaches, but been seeing elevated BPs.  No other aggravating or relieving factors. No other complaint.  Family history of high blood pressure.    The following portions of the patient's history were reviewed and updated as appropriate: allergies, current medications, past family history, past medical history, past social history, past surgical history and problem list.  Review of Systems As in subjective     Objective:   Physical Exam BP 150/90 mmHg  Pulse 85  Wt 164 lb (74.39 kg)  LMP  05/04/2015  BP Readings from Last 3 Encounters:  05/16/15 150/90  09/26/14 138/88  04/25/13 120/82   Wt Readings from Last 3 Encounters:  05/16/15 164 lb (74.39 kg)  09/26/14 162 lb 9.6 oz (73.755 kg)  04/25/13 150 lb (68.04 kg)   General appearance: alert, no distress, WD/WN HEENT: normocephalic, sclerae anicteric, PERRLA, EOMi, nares patent, no discharge or erythema, pharynx normal Oral cavity: MMM, no lesions Neck: supple, no lymphadenopathy, no thyromegaly, no masses, no bruits Heart: RRR, normal S1, S2, no murmurs Lungs: CTA bilaterally, no wheezes, rhonchi, or rales Musculoskeletal: nontender, no swelling, no obvious deformity Extremities: unremarkable Pulses: 2+ symmetric, upper and lower extremities Neurological: alert, oriented x 3, CN2-12 intact, strength normal upper extremities and lower extremities, sensation normal throughout, DTRs 2+ throughout, no cerebellar signs, gait normal Psychiatric: normal affect, behavior normal, pleasant     Assessment:   Encounter Diagnoses  Name Primary?  . Headache syndrome Yes  . Allergic rhinitis, unspecified allergic rhinitis type   . Asthma, moderate persistent, uncomplicated   . Elevated blood-pressure reading without diagnosis of hypertension   . Insomnia   . PCOS (polycystic ovarian syndrome)      Plan:   Advised that headache etiology is most likely multifactorial - related to increased stress, not sleeping well, abruptly stopped caffiene, glasses may need to be changes to different strength.    Gave short term hydrocodone to break the cycle.   discussed mediterranean diet,  healthy diet and exercise since she is trying to get pregnant.  Stop Singulair given desire to get pregnant.   Avoid lots of caffeine.  discussed ways to work on sleep hygiene. monitor BP and recheck on BP in 2wk.   Discussed red flag symptoms that would prompt urgent recheck on headaches or BP.    Follow up: 2wk.

## 2015-05-18 ENCOUNTER — Emergency Department (HOSPITAL_COMMUNITY)
Admission: EM | Admit: 2015-05-18 | Discharge: 2015-05-18 | Disposition: A | Discharge status: Home / Self Care | Payer: BLUE CROSS/BLUE SHIELD | Attending: Emergency Medicine | Admitting: Emergency Medicine

## 2015-05-18 ENCOUNTER — Encounter (HOSPITAL_COMMUNITY): Payer: Self-pay

## 2015-05-18 ENCOUNTER — Emergency Department (HOSPITAL_COMMUNITY): Payer: BLUE CROSS/BLUE SHIELD

## 2015-05-18 DIAGNOSIS — Z7951 Long term (current) use of inhaled steroids: Secondary | ICD-10-CM | POA: Diagnosis not present

## 2015-05-18 DIAGNOSIS — Z973 Presence of spectacles and contact lenses: Secondary | ICD-10-CM | POA: Diagnosis not present

## 2015-05-18 DIAGNOSIS — Z98811 Dental restoration status: Secondary | ICD-10-CM | POA: Diagnosis not present

## 2015-05-18 DIAGNOSIS — R42 Dizziness and giddiness: Secondary | ICD-10-CM | POA: Diagnosis present

## 2015-05-18 DIAGNOSIS — I10 Essential (primary) hypertension: Secondary | ICD-10-CM | POA: Insufficient documentation

## 2015-05-18 DIAGNOSIS — Z79899 Other long term (current) drug therapy: Secondary | ICD-10-CM | POA: Diagnosis not present

## 2015-05-18 DIAGNOSIS — Z872 Personal history of diseases of the skin and subcutaneous tissue: Secondary | ICD-10-CM | POA: Insufficient documentation

## 2015-05-18 DIAGNOSIS — G43809 Other migraine, not intractable, without status migrainosus: Secondary | ICD-10-CM

## 2015-05-18 DIAGNOSIS — Z8639 Personal history of other endocrine, nutritional and metabolic disease: Secondary | ICD-10-CM | POA: Diagnosis not present

## 2015-05-18 DIAGNOSIS — Z7984 Long term (current) use of oral hypoglycemic drugs: Secondary | ICD-10-CM | POA: Insufficient documentation

## 2015-05-18 DIAGNOSIS — J45909 Unspecified asthma, uncomplicated: Secondary | ICD-10-CM | POA: Diagnosis not present

## 2015-05-18 LAB — CBG MONITORING, ED: Glucose-Capillary: 120 mg/dL — ABNORMAL HIGH (ref 65–99)

## 2015-05-18 MED ORDER — KETOROLAC TROMETHAMINE 30 MG/ML IJ SOLN
30.0000 mg | Freq: Once | INTRAMUSCULAR | Status: AC
  Administered 2015-05-18: 30 mg via INTRAVENOUS
  Filled 2015-05-18: qty 1

## 2015-05-18 MED ORDER — ONDANSETRON HCL 4 MG/2ML IJ SOLN
4.0000 mg | Freq: Once | INTRAMUSCULAR | Status: AC
  Administered 2015-05-18: 4 mg via INTRAVENOUS
  Filled 2015-05-18: qty 2

## 2015-05-18 MED ORDER — SODIUM CHLORIDE 0.9 % IV BOLUS (SEPSIS)
1000.0000 mL | Freq: Once | INTRAVENOUS | Status: AC
  Administered 2015-05-18: 1000 mL via INTRAVENOUS

## 2015-05-18 NOTE — ED Provider Notes (Signed)
CSN: [id number]     Arrival date & time 05/18/15  1623 History   First MD Initiated Contact with Patient 05/18/15 1809     Chief Complaint  Patient presents with  . Migraine  . Hypertension  . Dizziness     (Consider location/radiation/quality/duration/timing/severity/associated sxs/prior Treatment) HPI   PCP: Carollee Herter, MD PMH: Allergy, asthma, PCO S,  diabetes  Regina Stevens is a 34 y.o.  female  Patient possess the emergency department with complaints of headache for the past 2 weeks straight. She also complains of hypertension. She presented she had hypertension while she was at her neurology appointment, was followed up with by her primary care provider who said it was only mildly elevated at 130/80 although this is high for the patient. The primary care provider also evaluated her for headache. He said lets try controlling the pain in her headache I will recheck your attention in 2 weeks. She has been taking hydrocodone that was prescribed to her and it has not helped her headache. She describes the headache as sometimes a bandlike across her bilateral temporal region and occipital region. She also reports waking up with a sharp pain to her left parietal scalp. She is concerned about the hypertension being very neutral for her highest value that she's taking being 150 systolic. She denies having any all factory changes, change in vision, ataxia, aphasia, denies dizziness for me, neck pain, diaphoresis, coughing, dysuria, dysphasia, aphasia, short of breath, abdominal pains, nausea, vomiting, diarrhea   Past Medical History  Diagnosis Date  . Allergy   . Spontaneous abortion 2006  . Wears glasses   . Asthma     2006 hospitalization  . Atopic dermatitis   . PCOS (polycystic ovarian syndrome)   . Routine gynecological examination 10/13    Dr. Kyung Bacca [facility]  . Wears partial dentures   . Influenza vaccine refused 10/14  . Pneumococcal vaccine refused      10/14   Past Surgical History  Procedure Laterality Date  . Cholecystectomy    . Wisdom tooth extraction     Family History  Problem Relation Age of Onset  . Hypertension Mother   . Angina Mother   . Cancer Paternal Uncle   . Heart disease Maternal Grandmother   . Cancer Maternal Grandmother   . Stroke Neg Hx   . Diabetes Neg Hx   . COPD Neg Hx   . Hyperlipidemia Neg Hx   . Kidney disease Neg Hx   . Asthma Other    Social History  Substance Use Topics  . Smoking status: Never Smoker   . Smokeless tobacco: Never Used  . Alcohol Use: 1.2 oz/week    2 Glasses of wine per week     Comment: occasinally   OB History    No data available     Review of Systems  10 Systems reviewed and are negative for acute change except as noted in the HPI.     Allergies  Benadryl; Citrus; Iodine; Morphine and related; and Shellfish allergy  Home Medications   Prior to Admission medications   Medication Sig Start Date End Date Taking? Authorizing Provider  albuterol (PROVENTIL HFA;VENTOLIN HFA) 108 (90 BASE) MCG/ACT inhaler 2 puffs q6 hours as needed for wheezing Patient taking differently: Inhale 2 puffs into the lungs every 6 (six) hours as needed for wheezing or shortness of breath.  04/25/13  Yes Kermit Balo Tysinger, PA-C  albuterol (PROVENTIL) (2.5 MG/3ML) 0.083% nebulizer solution Take 3 mLs (  2.5 mg total) by nebulization every 6 (six) hours as needed for wheezing. 11/06/12  Yes Kermit Balo Tysinger, PA-C  fexofenadine (ALLEGRA) 180 MG tablet Take 180 mg by mouth daily.   Yes Historical Provider, MD  Fluticasone-Salmeterol (ADVAIR) 100-50 MCG/DOSE AEPB Inhale 1 puff into the lungs 2 (two) times daily. Patient taking differently: Inhale 1 puff into the lungs daily.  11/06/12  Yes Kermit Balo Tysinger, PA-C  HYDROcodone-acetaminophen (NORCO/VICODIN) 5-325 MG tablet Take 1 tablet by mouth every 6 (six) hours as needed for moderate pain. 05/16/15  Yes Kermit Balo Tysinger, PA-C  metFORMIN  (GLUCOPHAGE) 500 MG tablet Take 500 mg by mouth 2 (two) times daily with a meal. 09/16/14  Yes Historical Provider, MD  mometasone (NASONEX) 50 MCG/ACT nasal spray Place 2 sprays into the nose daily. 10/15/14  Yes Kermit Balo Tysinger, PA-C  montelukast (SINGULAIR) 10 MG tablet TAKE 1 TABLET BY MOUTH EVERY NIGHT AT BEDTIME 11/12/14  Yes Kermit Balo Tysinger, PA-C  Prenatal Vit-Fe Fumarate-FA (PRENATAL MULTIVITAMIN) TABS tablet Take 1 tablet by mouth daily at 12 noon.   Yes Historical Provider, MD   BP 158/93 mmHg  Pulse 90  Temp(Src) 98.3 F (36.8 C) (Oral)  Resp 20  SpO2 99%  LMP 05/04/2015 Physical Exam  Constitutional: She appears well-developed and well-nourished. No distress.  HENT:  Head: Normocephalic and atraumatic.  Right Ear: Tympanic membrane and ear canal normal.  Left Ear: Tympanic membrane and ear canal normal.  Nose: Nose normal.  Mouth/Throat: Uvula is midline and oropharynx is clear and moist.  Eyes: Pupils are equal, round, and reactive to light.  Neck: Normal range of motion. Neck supple. No spinous process tenderness and no muscular tenderness present. No Brudzinski's sign and no Kernig's sign noted.  Cardiovascular: Normal rate and regular rhythm.   Pulmonary/Chest: Effort normal and breath sounds normal. No respiratory distress.  Abdominal: Soft.  Neurological: She is alert.  Cranial nerves II-VIII and X-XII evaluated and show no deficits. Pt alert and oriented x 3 Upper and lower extremity strength is symmetrical and physiologic Normal muscular tone No facial droop Coordination intact, no limb ataxia, finger-nose-finger normal Rapid alternating movements normal No pronator drift  Skin: Skin is warm and dry.  Nursing note and vitals reviewed.   ED Course  Procedures (including critical care time) Labs Review Labs Reviewed  CBG MONITORING, ED - Abnormal; Notable for the following:    Glucose-Capillary 120 (*)    All other components within normal limits     Imaging Review Ct Head Wo Contrast  05/18/2015  CLINICAL DATA:  Acute onset of high blood pressure and dizziness. Right-sided headache, radiating to the neck and right shoulder. Photosensitivity. Initial encounter. EXAM: CT HEAD WITHOUT CONTRAST TECHNIQUE: Contiguous axial images were obtained from the base of the skull through the vertex without intravenous contrast. COMPARISON:  None. FINDINGS: There is no evidence of acute infarction, mass lesion, or intra- or extra-axial hemorrhage on CT. The posterior fossa, including the cerebellum, brainstem and fourth ventricle, is within normal limits. The third and lateral ventricles, and basal ganglia are unremarkable in appearance. The cerebral hemispheres are symmetric in appearance, with normal gray-white differentiation. No mass effect or midline shift is seen. There is no evidence of fracture; visualized osseous structures are unremarkable in appearance. The orbits are within normal limits. The paranasal sinuses and mastoid air cells are well-aerated. No significant soft tissue abnormalities are seen. IMPRESSION: Unremarkable noncontrast CT of the head. Electronically Signed   By: Beryle Beams.D.  On: 05/18/2015 19:35   I have personally reviewed and evaluated these images and lab results as part of my medical decision-making.   EKG Interpretation None      MDM   Final diagnoses:  Other migraine without status migrainosus, not intractable    Negative head CT, BP in the ED is 136/96 in the ED. Her CBG is 120.  Presentation is non concerning for [facility], ICH, Meningitis, or temporal arteritis. Pt is afebrile with no focal neuro deficits, nuchal rigidity, or change in vision. The patient denies any symptoms of neurological impairment or TIA's; no amaurosis, diplopia, dysphasia, or unilateral disturbance of motor or sensory function. No loss of balance or vertigo.  Will give referral to neurology for ongoing migraine. It was able to be  resolved in the ED today, now pain is 0/10 and she is currently not having any pain. Will also refer back to her PCP.  Medications  sodium chloride 0.9 % bolus 1,000 mL (0 mLs Intravenous Stopped 05/18/15 2103)  ondansetron (ZOFRAN) injection 4 mg (4 mg Intravenous Given 05/18/15 1941)  ketorolac (TORADOL) 30 MG/ML injection 30 mg (30 mg Intravenous Given 05/18/15 2103)    34 y.o.Deann Fortson-Fuller's medical screening exam was performed and I feel the patient has had an appropriate workup for their chief complaint at this time and likelihood of emergent condition existing is low. They have been counseled on decision, discharge, follow up and which symptoms necessitate immediate return to the emergency department. They or their family verbally stated understanding and agreement with plan and discharged in stable condition.   Vital signs are stable at discharge. Filed Vitals:   05/18/15 2057  BP: 158/93  Pulse: 90  Temp:   Resp: [address]20       Mkayla Steele, PA-C 05/18/15 2111  Margarita Grizzleanielle Ray, MD 05/18/15 [phone]

## 2015-05-18 NOTE — ED Notes (Addendum)
Patient c/o hypertension and dizziness. Patient denies history of HTN and dizziness Patient states she has had a headache right side that radiates down the neck into the right shoulder x 10 days and is now worsened. Patient states she was given meds for her headaches which had lessened the headaches, but pain worsened today. Patient is sensitive to light.

## 2015-05-29 ENCOUNTER — Telehealth: Payer: Self-pay

## 2015-05-29 ENCOUNTER — Ambulatory Visit (INDEPENDENT_AMBULATORY_CARE_PROVIDER_SITE_OTHER): Payer: BLUE CROSS/BLUE SHIELD | Admitting: Medical

## 2015-05-29 ENCOUNTER — Encounter: Payer: Self-pay | Admitting: Medical

## 2015-05-29 VITALS — BP 118/90 | HR 107 | Wt 158.0 lb

## 2015-05-29 DIAGNOSIS — G4489 Other headache syndrome: Secondary | ICD-10-CM | POA: Diagnosis not present

## 2015-05-29 DIAGNOSIS — R030 Elevated blood-pressure reading, without diagnosis of hypertension: Secondary | ICD-10-CM

## 2015-05-29 DIAGNOSIS — G4700 Insomnia, unspecified: Secondary | ICD-10-CM

## 2015-05-29 LAB — CBC
HCT: 38 % (ref 36.0–46.0)
Hemoglobin: 12.5 g/dL (ref 12.0–15.0)
MCH: 28.6 pg (ref 26.0–34.0)
MCHC: 32.9 g/dL (ref 30.0–36.0)
MCV: 87 fL (ref 78.0–100.0)
MPV: 10.2 fL (ref 8.6–12.4)
PLATELETS: 280 10*3/uL (ref 150–400)
RBC: 4.37 MIL/uL (ref 3.87–5.11)
RDW: 13.5 % (ref 11.5–15.5)
WBC: 6.2 10*3/uL (ref 4.0–10.5)

## 2015-05-29 LAB — BASIC METABOLIC PANEL
BUN: 8 mg/dL (ref 7–25)
CALCIUM: 9.7 mg/dL (ref 8.6–10.2)
CO2: 26 mmol/L (ref 20–31)
CREATININE: 0.76 mg/dL (ref 0.50–1.10)
Chloride: 103 mmol/L (ref 98–110)
Glucose, Bld: 87 mg/dL (ref 65–99)
Potassium: 3.8 mmol/L (ref 3.5–5.3)
Sodium: 136 mmol/L (ref 135–146)

## 2015-05-29 LAB — TSH: TSH: 0.79 u[IU]/mL (ref 0.350–4.500)

## 2015-05-29 MED ORDER — LABETALOL HCL 100 MG PO TABS: ORAL_TABLET | ORAL | Status: DC

## 2015-05-29 NOTE — Telephone Encounter (Signed)
Informed pt .

## 2015-05-29 NOTE — Telephone Encounter (Signed)
I sent Labetolol, make sure it went to right pharmacy

## 2015-05-29 NOTE — Progress Notes (Signed)
Subjective: Chief Complaint  Patient presents with  . Follow-up    does not have headache log. said that it is not continously anymore but is on and off.     Regina Stevens is a 34 y.o. female who presents for follow up on headache.  I saw her about a week ago for recent headaches. She ended up going to the ED the day after I saw her for intractable headache as the hydrocodone wasn't helping.  Had headache cocktail at the ED, had normal head CT, advised to see neurology or PCP.   Since last visit has had 3 headaches per week.  Feels under stress. Thinks a lot of her headaches are stress induced and gets neck and back of head tension.    In the last month has had some headaches, headaches typically start in temples.  Prior to this past week was getting headache once a week, generally bilateral temples, sometimes eye ache with the headaches.   Having some neck tension.  Denies nausea.  She does report photophobia but no phonophobia.  denies any other aura.  Generally with the headaches she would not have neck tension but she has had this over the past week.   Wears glasses, prescription out of date, squinting a lot.   She notes having to abruptly stop birth control 05/04/15 having problems with PCOS, starting another round of IVF.  She does note stopping caffeine 05/04/15.  weight is up and down.  Asthma flared 3 days ago, but this resolved.   On her daily allergy medication.   No numbness, tingling weakness.  Has checked blood pressures some since having headaches, but been seeing elevated BPs.  No other aggravating or relieving factors. No other complaint.  Family history of high blood pressure.    The following portions of the patient's history were reviewed and updated as appropriate: allergies, current medications, past family history, past medical history, past social history, past surgical history and problem list.  Review of Systems As in subjective     Objective:   Physical Exam BP  118/90 mmHg  Pulse 107  Wt 158 lb (71.668 kg)  SpO2 99%  LMP 05/04/2015  BP Readings from Last 3 Encounters:  05/29/15 118/90  05/18/15 158/93  05/16/15 150/90   Wt Readings from Last 3 Encounters:  05/29/15 158 lb (71.668 kg)  05/16/15 164 lb (74.39 kg)  09/26/14 162 lb 9.6 oz (73.755 kg)   General appearance: alert, no distress, WD/WN HEENT: normocephalic, sclerae anicteric, PERRLA, EOMi, nares patent, no discharge or erythema, pharynx normal Oral cavity: MMM, no lesions Neck: supple, no lymphadenopathy, no thyromegaly, no masses, no bruits Heart: RRR, normal S1, S2, no murmurs Lungs: CTA bilaterally, no wheezes, rhonchi, or rales Musculoskeletal: nontender, no swelling, no obvious deformity Extremities: unremarkable Pulses: 2+ symmetric, upper and lower extremities Neurological: alert, oriented x 3, CN2-12 intact, strength normal upper extremities and lower extremities, sensation normal throughout, DTRs 2+ throughout, no cerebellar signs, gait normal Psychiatric: normal affect, behavior normal, pleasant     Assessment:   Encounter Diagnoses  Name Primary?  . Headache syndrome Yes  . Elevated blood-pressure reading without diagnosis of hypertension   . Insomnia      Plan:   Advised that headache etiology is most likely multifactorial - related to increased stress, not sleeping well, abruptly stopped caffeine, glasses may need to be changes to different strength.  Given the elevated BPs, begin trial of low dose Labetaolol.  Discussed risks/benefits of medication.  Discussed home monitoring of BPs.   Discussed other alternatives for treatment including Topamax or similar prophylactic medication, anti-anxiety medication.  However, give her IV treatment and goal to get pregnant, we will start with Labetalol for BP elevation and headaches.    discussed stress reduction, consider counseling.   Reviewed ED report and CT head.  Labs today. F/u pending labs.

## 2015-05-29 NOTE — Telephone Encounter (Signed)
Pt was here this morning, she said you prescribed a low-dose beta blocker. She wants to know the name of this and if it got sent to the [facility] on [address]. She called the pharmacy and they haven't received anything.

## 2015-07-20 NOTE — L&D Delivery Note (Signed)
PROCEDURE DATE: 03/01/2016  PREOPERATIVE DIAGNOSIS: Intrauterine pregnancy at [redacted]w[redacted]d weeks gestation, arrest of dilation remote from delivery w/SROM>30 hours  POSTOPERATIVE DIAGNOSIS:The same  PROCEDURE: Low TransverseCesarean Section  SURGEON: Dr. Elise Stevens  INDICATIONS:Regina Stevens is a35 y.o.G4P0 at [redacted]w[redacted]d scheduled for cesarean section secondary to arrest of dilation at 3cm.She s/p SROM at 1400 yesterday. Despite aggressive pitocin titration, unable to reach adequate ctxn pattern and SVE unchanged from ~3cm for greater than 12 hours. At last two SVEs, cervix swollen and fetal head high with ~3cm of caput. Decision made to proceed with pLTCS.The risks of cesarean section discussed with the patient included but were not limited to: bleeding which may require transfusion or reoperation; infection which may require antibiotics; injury to bowel, bladder, ureters or other surrounding organs; injury to the fetus; need for additional procedures including hysterectomy in the event of a life-threatening hemorrhage; placental abnormalities wth subsequent pregnancies, incisional problems, thromboembolic phenomenon and other postoperative/anesthesia complications. The patient concurred with the proposed plan, giving informed written consent for the procedure.   FINDINGS: Viable femaleinfant in cephalic presentation,APGARs 8,9: Weight pending  Clearscant amniotic fluid. Intact placenta, three vessel cord. Grossly normal uterus, ovaries and fallopian tubes. .  ANESTHESIA: Epidural ESTIMATED BLOOD LOSS: 1000ml SPECIMENS: Placenta for L&D COMPLICATIONS: None immediate  PROCEDURE IN DETAIL: The patient received intravenous antibiotics (2g Ancef) and had sequential compression devices applied to her lower extremities while in the preoperative area. Shewasthen taken to the operating roomwhere epidural anesthesiawas dosed up to surgical level andwas found to be  adequate. She was then placed in a dorsal supine position with a leftward tilt,and prepped and draped in a sterile manner.A foley catheter was placed into her bladder and attached to constant gravity. After an adequate timeout was performed, aPfannenstiel skin incision was made with scalpel and carried through to the underlying layer of fascia. The fascia was incised in the midline and this incision was extended bilaterally using the Mayo scissors. Kocher clamps were applied to the superior aspect of the fascial incision and the underlying rectus muscles were dissected off bluntly. A similar process was carried out on the inferior aspect of the facial incision. The rectus muscles were separated in the midline bluntly and the peritoneum was entered bluntly. A bladder flap was created sharply and developed bluntly.Atransverse hysterotomy was made with a scalpel and extended bilaterally bluntly. The bladder blade was then removed. The infant was successfully delivered, and cord was clamped and cut and infant was handed over to awaiting neonatology team. Uterine massage was then administered and the placenta delivered intact with three-vessel cord. Cord gases were taken. The uterus was cleared of clot and debris. The hysterotomy was closed with 0 vicryl. A second imbricating suture of 0-vicryl was used to reinforce the incision and aid in hemostasis. The uterus was denuded on the left lower uterine segment superficially and was sewn over using 2/0 vicryl in a figure of eight. The fascia was closed with 0-Vicryl in a running fashion with good restoration of anatomy. The subcutaneus tissue was irrigated and was reapproximated using three interrupted plain gut stitches. The skin was closed with 4-0 Vicryl in a subcuticular fashion.  Final EBL was 1,000cc (all surgical site and was hemostatic at end of procedure) without any further bleeding on exam.   Pt tolerated the procedure well. All  sponge/lap/needle counts were correct  X 2. Pt taken to recovery room in stable condition.   Regina Leger MD    without any further bleeding on exam.   Pt tolerated the procedure well. All sponge/lap/needle  counts were correct  X 2. Pt taken to recovery room in stable condition.   Regina AgeeElise Leger MD

## 2015-08-19 LAB — OB RESULTS CONSOLE RPR: RPR: NONREACTIVE

## 2015-08-19 LAB — OB RESULTS CONSOLE HIV ANTIBODY (ROUTINE TESTING): HIV: NONREACTIVE

## 2015-08-19 LAB — OB RESULTS CONSOLE ABO/RH: RH TYPE: POSITIVE

## 2015-08-19 LAB — OB RESULTS CONSOLE HEPATITIS B SURFACE ANTIGEN: Hepatitis B Surface Ag: NEGATIVE

## 2015-08-19 LAB — OB RESULTS CONSOLE RUBELLA ANTIBODY, IGM: RUBELLA: IMMUNE

## 2015-08-19 LAB — OB RESULTS CONSOLE ANTIBODY SCREEN: Antibody Screen: NEGATIVE

## 2015-08-21 LAB — OB RESULTS CONSOLE GC/CHLAMYDIA
Chlamydia: NEGATIVE
Gonorrhea: NEGATIVE

## 2015-09-08 ENCOUNTER — Other Ambulatory Visit: Payer: Self-pay | Admitting: Medical

## 2015-09-26 ENCOUNTER — Encounter (HOSPITAL_COMMUNITY): Payer: BLUE CROSS/BLUE SHIELD

## 2015-10-09 ENCOUNTER — Encounter (HOSPITAL_COMMUNITY): Payer: Self-pay

## 2015-10-10 ENCOUNTER — Ambulatory Visit (HOSPITAL_COMMUNITY)
Admission: RE | Admit: 2015-10-10 | Discharge: 2015-10-10 | Disposition: A | Discharge status: Home / Self Care | Payer: BLUE CROSS/BLUE SHIELD | Source: Ambulatory Visit | Attending: Obstetrics and Gynecology | Admitting: Obstetrics and Gynecology

## 2015-10-10 ENCOUNTER — Inpatient Hospital Stay (HOSPITAL_COMMUNITY): Admission: RE | Admit: 2015-10-10 | Payer: BLUE CROSS/BLUE SHIELD | Source: Ambulatory Visit

## 2015-10-10 DIAGNOSIS — Z148 Genetic carrier of other disease: Secondary | ICD-10-CM

## 2015-10-13 ENCOUNTER — Other Ambulatory Visit (HOSPITAL_COMMUNITY): Payer: Self-pay

## 2015-10-13 DIAGNOSIS — Z148 Genetic carrier of other disease: Secondary | ICD-10-CM | POA: Insufficient documentation

## 2015-10-13 DIAGNOSIS — Z3A19 19 weeks gestation of pregnancy: Secondary | ICD-10-CM | POA: Insufficient documentation

## 2015-10-13 NOTE — Progress Notes (Signed)
Genetic Counseling  High-Risk Gestation Note  Appointment Date:  10/10/2015 Referred By: Molli Posey, MD Date of Birth:  1981-05-19 Partner: Regina Stevens   Pregnancy History: [id number] Estimated Date of Delivery: 03/05/16 Estimated Gestational Age: 59w0dAttending: JViann Fish MD  I met with [name] her partner, 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   M50of carriers do not develop Parkinson disease  Her husband, [name] had expanded carrier screening and was screen negative for Gaucher disease  Reviewed autosomal recessive inheritance of Gaucher disease  Carrier screen has approximate 99% detection rate  Thus, [name] to be a carrier has been reduced to approximately 1 in 7,001; meaning the residual risk for Gaucher disease in the current pregnancy is approximately 1 in 28,004.   The couple was comfortable with this risk assessment and declined pursuing additional genetic testing for [name] at this time.   However, the expanded carrier screen for Mr. FToy Cookeyidentified him to be heterozygous for Factor V Leiden (Mrs. FBautchtesting for Factor V Leiden was negative)  Mr. FToy Cookeyreported no personal or family history of blood clots  Reviewed that FVL heterozygotes identified from general population screening have mildly increased risk for VTE, with absolute incidence of VTE estimated to be 1 in 500.    Mr. FToy Cookeyshould discuss these results with his primary care physician  Recurrence risk for each pregnancy is a 1 in 2 (50%); However, asymptomatic  testing for minors is not typically performed  [name] had expanded carrier screening (Precise Genetic Carrier Screening) facilitated [facility] DRolin Barrywith [facility]. The results of the screen for [name] were positive for carrier status for Gaucher disease. 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 We discussed that her carrier screening was negative for the additional mutations/conditions screened. Thus, her risk to be a carrier for these additional conditions (listed separately in the laboratory report) has been reduced but not eliminated. The prevalence of each condition varies (and often varies with ethnicity). Thus the patient's background risk to be a carrier for each of these various conditions would range, and in some cases be very low or unknown. Similarly, the detection rate varies with each condition and also varies in some cases with ethnicity, ranging from greater than 99% to unknown. We reviewed that a negative carrier screen would thus reduce but not eliminate the chance to be a carrier for these conditions. [name] reported no additional relatives known to be Gaucher disease carriers, and the couple reported no known relatives with Gaucher disease. [name] reported ASer[place] and JIsle [place]estry, and Mr. FToy Cookeyreported [place]omania [place] and [place]estry. He reported no known Ashkenazi Jewish ancestry. The couple denied the presence of consanguinity.   Carrier screening was subsequently performed for Mr. FToy Cookeyvia the same expanded carrier screening panel, and he was reported to be screen negative for Gaucher disease. The detection rate is reported to be 99% for Gaucher disease from this particular lab. Thus, his risk to be a carrier has been reduced from the general population risk (given no known  family history of Gaucher and no  known consanguinity to [name]) to approximately 1 in 7,001. 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 (Further investigation of [name]'s carrier screening report indicated that her testing for Factor V Leiden was within normal range).   We spent time discussing Gaucher disease (GD), genes, and autosomal recessive inheritance. GD is a genetic condition with a continuum of clinical findings due to deficiency of glucocerebrosidase enzyme activity. It is typically divided into three major clinical types (1, 2, and 3), and two other subtypes (perinatal-lethal and cardiovascular). GD type 1 is the most common type and is characterized by bone disease, hepatosplenomegaly, anemia and thrombocytopenia, and lung disease, with onset of symptoms ranging from childhood to adulthood. GD types 2 and 3 are characterized by the presence of primary neurologic disease. Type 2 typically has onset of symptoms before age two years with a rapidly progressive course, causing death typically by age 44-4 years. Type 3 has a variable age of onset, sometimes prior to age 44 years, but with a more slowly progressive course and survival into the 3rd or 4th decade. Characteristics of perinatal-lethal form include ichthyosiform with nonimmune hydrops fetalis, and the cardiovascular form includes calcification of the aortic and mitral valves, mild splenomegaly, corneal opacities, and supranuclear ophthalmoplegia.  We discussed that genotype-phenotype correlations exist. The D448H allele, which is the particular mutation that the patient was identified to carry, has not been frequently reported but has been reported to occur in an individual with atypical form of GD with features of types 2 and 3. .   Diagnosis of GD for symptomatic individuals is typically established by enzymatic assay of glucocerebrosidase activity in leukocytes or other nucleated cells.  Enzyme activity assessment is not typically reliable for carrier detection. Treatment for Gaucher disease includes enzyme replacement therapy or substrate reduction therapy. Symptomatic treatment includes partial or total splenectomy. Supportive care may include blood transfusion for anemia and bleeding, analgesics for bone pain, joint replacement surgery, and oral bisphosphonates and calcium for osteoporosis.   We spent time reviewing the autosomal recessive inheritance of Gaucher disease. Individuals with Gaucher disease possess two nonworking copies of the GBA gene, resulting in glucosylceramidase enzyme deficiency.   We discussed that when both parents are carriers for Gaucher disease, each pregnancy has an independent chance for one of the following outcomes: a 25% chance to inherit both mutations and thus have the condition; a 50% chance to inherit one gene mutation and be a carrier similar to parents; and a 25% chance to be neither a carrier nor have Gaucher disease. When one parent is a carrier but the other is not, then each pregnancy has a 1 in 2 chance to be a carrier but would not be expected to be at increased risk to inherit Gaucher disease. Given [name]'s reported family history and ethnicity, he would have the general population risk to be a carrier for GD, prior to carrier screening. The carrier screening performed for him is reported to have 99% detection rate. Thus, his negative carrier screen reduces his risk to be a carrier from approximately 1 in 73 to approximately 1 in 7,001. Thus, the risk for Gaucher disease in a pregnancy together has been significantly reduced to approximately 1 in 28,004. The couple was comfortable with this risk assessment.   We reviewed that when both parents are identified to be Gaucher disease carriers, prenatal diagnosis via amniocentesis (or chorionic villus sampling in the  first trimester) would be available, if desired. The risks, benefits, and  limitations of amniocentesis were reviewed.  We discussed the associated 1 in [phone] risk for complications, including spontaneous pregnancy loss.  The couple indicated that even in the event that they were both identified to be carriers of a condition, they would not likely consider amniocentesis.   There are some reports of increased risk for parkinsonian features in individuals with Gaucher disease and also in obligate carriers for Gaucher disease. Thus, there is suggestion that carriers of GD may be at increased risk for development of features of parkinson disease. This association is not well established at this time. A 2013 study (Rana et al) evaluated patients with GD and family members who were obligate carriers from 35 families. They estimated that the risk for parkinson disease in obligate carriers to be 2.2% by age 43 years old and 10.9% by age 62 years old; the vast 51 of individuals who had Gaucher disease or are obligate carriers do not develop Parkinson disease.   We spent time reviewing [name]'s carrier screen result, which identified him to carry Factor V Leiden. [name]'s carrier screen was within normal limits regarding Factor V Leiden. We also reviewed that the names and identification information on the lab reports were very difficult to see given the nature of the copies that came across. Factor V is a protein in the body which helps to promote the conversion of prothrombin to thrombin.  Activated factor V is typically inactivated by a complex that forms between activated protein C and S.  It was discovered that some individuals have an inherited condition that prevents the protein /protein S complex from effectively inactivating activated factor V (termed activated protein C resistance).  The majority (>90%) of individuals with this phenotype were found to have a single amino acid substitution ([id number]) in the factor V gene.  This mutation has been termed factor V  Leiden (FVL).  In these individuals, activated factor V is not degraded as efficiently, resulting in increased coagulability.  FVL is the most common inherited thrombophilia. The exact prevalence is difficult to quantify, given that asymptomatic individuals may be less often ascertained. We discussed that the clinical expression of factor V Leiden is highly variable and that many individuals with FVL never develop thrombosis. However, the risk for venous thrombosis events has been estimated to be increased 3-8 fold for FVL heterozygotes. Given that this risk is also influenced by additional factors, including circumstantial risk factors, it would be important for Mr. Regina Stevens to discuss this information with his primary care physician.   As Mr. Regina Stevens has the FVL mutation, with each pregnancy there is a 50% chance (1 in 2) to pass on the FVL gene vs. the normal factor V gene. However, asymptomatic testing for minors for FVL heterozygous status is not typically performed.    Both family histories were reviewed and were otherwise noncontributory for birth defect, intellectual disability, recurrent pregnancy loss, and known genetic conditions. Without further information regarding the provided family history, an accurate genetic risk cannot be calculated. Further genetic counseling is warranted if more information is obtained.    [name] denied exposure to environmental toxins or chemical agents. She denied the use of alcohol, tobacco or street drugs. She denied significant viral illnesses during the course of her pregnancy. Her medical and surgical histories were noncontributory.   I counseled this couple regarding the above risks and available options. The approximate face-to-face time with the genetic  counselor was 40 minutes.   Chipper Oman, MS Certified Genetic Counselor 10/13/2015

## 2015-10-22 ENCOUNTER — Other Ambulatory Visit (HOSPITAL_COMMUNITY): Payer: Self-pay

## 2015-10-22 ENCOUNTER — Encounter (HOSPITAL_COMMUNITY): Payer: Self-pay

## 2015-11-18 ENCOUNTER — Other Ambulatory Visit: Payer: Self-pay | Admitting: Medical

## 2015-11-18 NOTE — Telephone Encounter (Signed)
Pt is pregnant so wanted to be sure this is ok to fill

## 2015-11-19 NOTE — Telephone Encounter (Signed)
Lets refill and get her in for a physical

## 2015-12-08 ENCOUNTER — Encounter: Payer: Self-pay | Admitting: Medical

## 2015-12-08 ENCOUNTER — Ambulatory Visit (INDEPENDENT_AMBULATORY_CARE_PROVIDER_SITE_OTHER): Payer: BLUE CROSS/BLUE SHIELD | Admitting: Medical

## 2015-12-08 VITALS — BP 128/90 | HR 102 | Ht 58.75 in | Wt 164.0 lb

## 2015-12-08 DIAGNOSIS — J4540 Moderate persistent asthma, uncomplicated: Secondary | ICD-10-CM

## 2015-12-08 DIAGNOSIS — G43809 Other migraine, not intractable, without status migrainosus: Secondary | ICD-10-CM | POA: Diagnosis not present

## 2015-12-08 DIAGNOSIS — J309 Allergic rhinitis, unspecified: Secondary | ICD-10-CM

## 2015-12-08 DIAGNOSIS — Z0000 Encounter for general adult medical examination without abnormal findings: Secondary | ICD-10-CM | POA: Diagnosis not present

## 2015-12-08 DIAGNOSIS — R030 Elevated blood-pressure reading, without diagnosis of hypertension: Secondary | ICD-10-CM | POA: Diagnosis not present

## 2015-12-08 MED ORDER — LABETALOL HCL 100 MG PO TABS: ORAL_TABLET | ORAL | Status: DC

## 2015-12-08 NOTE — Progress Notes (Signed)
Subjective:   HPI  Regina Stevens is a 35 y.o. female who presents for a complete physical.    Sees Dr. Marcelle OverlieHolland, OB/Gyn is 7 months pregnant.   LMP 04/2015.  Has had 2 pregnancies, 1 miscarriage.     Concerns: Asthma - doing fine on current regimen.  No c/o.   Not currently using Advair.   Eczema -no major concerns recently  Allergies - has bad allergies, using nasonex and zyrtec, ok by OB/gyn  Reviewed their medical, surgical, family, social, medication, and allergy history and updated chart as appropriate.  Past Medical History  Diagnosis Date  . Allergy   . Spontaneous abortion 2006  . Wears glasses   . Asthma     2006 hospitalization  . Atopic dermatitis   . PCOS (polycystic ovarian syndrome)   . Routine gynecological examination 10/13    Dr. [facility]elle [facility]Holland, [facility]  . Wears partial dentures   . Influenza vaccine refused 10/14  . Pneumococcal vaccine refused     10/14    Past Surgical History  Procedure Laterality Date  . Cholecystectomy    . Wisdom tooth extraction      Social History   Social History  . Marital Status: Married    Spouse Name: N/A  . Number of Children: N/A  . Years of Education: N/A   Occupational History  . bank teller at [employer]    Social History Main Topics  . Smoking status: Never Smoker   . Smokeless tobacco: Never Used  . Alcohol Use: No     Comment: occasinally  . Drug Use: No  . Sexual Activity: Yes    Birth Control/ Protection: None   Other Topics Concern  . Not on file   Social History Narrative   Exercise - walking regularly. No children, Married.  Teller at [employer].  As of 11/2015    Family History  Problem Relation Age of Onset  . Hypertension Mother   . Angina Mother   . Cancer Paternal Uncle   . Heart disease Maternal Grandmother   . Cancer Maternal Grandmother   . Stroke Neg Hx   . Diabetes Neg Hx   . COPD Neg Hx   . Hyperlipidemia Neg Hx   . Kidney disease Neg Hx   . Asthma Other       Current outpatient prescriptions:  .  albuterol (PROVENTIL HFA;VENTOLIN HFA) 108 (90 BASE) MCG/ACT inhaler, 2 puffs q6 hours as needed for wheezing, Disp: 1 Inhaler, Rfl: 1 .  albuterol (PROVENTIL) (2.5 MG/3ML) 0.083% nebulizer solution, Take 3 mLs (2.5 mg total) by nebulization every 6 (six) hours as needed for wheezing., Disp: 150 mL, Rfl: 2 .  aspirin 81 MG tablet, Take 81 mg by mouth daily., Disp: , Rfl:  .  cetirizine (ZYRTEC) 10 MG tablet, Take 10 mg by mouth daily., Disp: , Rfl:  .  labetalol (NORMODYNE) 100 MG tablet, TAKE 1/2 TABLET BY MOUTH TWICE A DAY., Disp: 90 tablet, Rfl: 3 .  mometasone (NASONEX) 50 MCG/ACT nasal spray, Place 2 sprays into the nose daily., Disp: 17 g, Rfl: 3 .  Prenatal Vit-Fe Fumarate-FA (PRENATAL MULTIVITAMIN) TABS tablet, Take 1 tablet by mouth daily at 12 noon., Disp: , Rfl:  .  Fluticasone-Salmeterol (ADVAIR) 100-50 MCG/DOSE AEPB, Inhale 1 puff into the lungs 2 (two) times daily. (Patient not taking: Reported on 12/08/2015), Disp: 60 each, Rfl: 5  Allergies  Allergen Reactions  . Benadryl [Diphenhydramine Hcl] Anaphylaxis, Hives and Swelling  Swelling, hives  . Citrus Hives and Rash    Citrus flavors  . Iodine Anaphylaxis and Rash  . Morphine And Related Anaphylaxis and Hives  . Shellfish Allergy Anaphylaxis and Hives   Review of Systems Constitutional: -fever, -chills, -sweats, -unexpected weight change, -decreased appetite, -fatigue Allergy: -sneezing, -itching, -congestion Dermatology: -changing moles, --rash, -lumps ENT: -runny nose, -ear pain, -sore throat, -hoarseness, -sinus pain, -teeth pain, - ringing in ears, -hearing loss, -nosebleeds Cardiology: -chest pain, -palpitations, -swelling, -difficulty breathing when lying flat, -waking up short of breath Respiratory: -cough, -shortness of breath, -difficulty breathing with exercise or exertion, -wheezing, -coughing up blood Gastroenterology: -abdominal pain, -nausea, -vomiting, -diarrhea,  -constipation, -blood in stool, -changes in bowel movement, -difficulty swallowing or eating Hematology: -bleeding, -bruising  Musculoskeletal: -joint aches, -muscle aches, -joint swelling, -back pain, -neck pain, -cramping, -changes in gait Ophthalmology: denies vision changes, eye redness, itching, discharge Urology: -burning with urination, -difficulty urinating, -blood in urine, -urinary frequency, -urgency, -incontinence Neurology: -headache, -weakness, +tingling, +numbness, -memory loss, -falls, -dizziness Psychology: -depressed mood, -agitation, -sleep problems     Objective:   Physical Exam  BP 128/90 mmHg  Pulse 102  Ht 4' 10.75" (1.492 m)  Wt 164 lb (74.39 kg)  BMI 33.42 kg/m2  LMP 06/13/2015  General appearance: alert, no distress, WD/WN, AA female Skin: unremarkable, few scattered benign appearing macules , some very mild rough patches of skin c/w eczema along posterior neck, bilat lateral antecubital regions HEENT: normocephalic, conjunctiva/corneas normal, sclerae anicteric, PERRLA, EOMi, nares with clear discharge, right turbinate swollen and pale, not patent, pharynx normal  Oral cavity: MMM, tongue normal, few bottom teeth with decay, upper denture Neck: supple, no lymphadenopathy, no thyromegaly, no masses, normal ROM, no bruits  Chest: non tender, normal shape and expansion  Heart: RRR, normal S1, S2, no murmurs  Lungs: CTA bilaterally, no wheezes, rhonchi, or rales  Abdomen: +bs, soft, gravid uterus, non tender, non distended, no masses, no hepatomegaly, no splenomegaly, no bruits  Back: non tender, normal ROM, no scoliosis  Musculoskeletal: upper extremities non tender, no obvious deformity, normal ROM throughout, lower extremities non tender, no obvious deformity, normal ROM throughout  Extremities: no edema, no cyanosis, no clubbing  Pulses: 2+ symmetric, upper and lower extremities, normal cap refill  Neurological: alert, oriented x 3, CN2-12 intact, strength  normal upper extremities and lower extremities, sensation normal throughout, DTRs 2+ throughout, no cerebellar signs, gait normal  Psychiatric: normal affect, behavior normal, pleasant  Breast/pelvic deferred to gyn   Assessment and Plan :    Encounter Diagnoses  Name Primary?  . Encounter for health maintenance examination in adult Yes  . Asthma, moderate persistent, uncomplicated   . Allergic rhinitis, unspecified allergic rhinitis type   . Elevated blood-pressure reading without diagnosis of hypertension   . Other migraine without status migrainosus, not intractable    Physical exam - discussed healthy lifestyle, diet, exercise, preventative care, vaccinations, and addressed their concerns.  See dentist and eye doctor yearly.  She has routine prenatal care.  Advised she make sure the [facility] physician reviewed her medication next visit, but she notes Dr. Marcelle Overlie is aware of her medications and is ok with the current regimen Asthma - doing ok, c/t albuterol prn Allergic rhinitis - on nasal spray and oral antihistamine, ok 'd by OB/Gyn Elevated BP, migraine - c/t Labetalol C/t routine prenatal care

## 2016-01-01 ENCOUNTER — Telehealth: Payer: Self-pay | Admitting: Internal Medicine

## 2016-01-01 NOTE — Telephone Encounter (Signed)
[facility].

## 2016-01-06 ENCOUNTER — Encounter: Payer: Self-pay | Admitting: Medical

## 2016-02-11 LAB — OB RESULTS CONSOLE GBS: GBS: POSITIVE

## 2016-02-29 ENCOUNTER — Inpatient Hospital Stay (HOSPITAL_COMMUNITY)
Admission: AD | Admit: 2016-02-29 | Discharge: 2016-03-03 | DRG: 765 | Disposition: A | Discharge status: Home / Self Care | Payer: BLUE CROSS/BLUE SHIELD | Source: Ambulatory Visit | Attending: Obstetrics and Gynecology | Admitting: Obstetrics and Gynecology

## 2016-02-29 ENCOUNTER — Encounter (HOSPITAL_COMMUNITY): Payer: Self-pay | Admitting: *Deleted

## 2016-02-29 DIAGNOSIS — O34219 Maternal care for unspecified type scar from previous cesarean delivery: Secondary | ICD-10-CM | POA: Diagnosis present

## 2016-02-29 DIAGNOSIS — Z3A39 39 weeks gestation of pregnancy: Secondary | ICD-10-CM

## 2016-02-29 DIAGNOSIS — O163 Unspecified maternal hypertension, third trimester: Secondary | ICD-10-CM | POA: Diagnosis present

## 2016-02-29 DIAGNOSIS — O99824 Streptococcus B carrier state complicating childbirth: Secondary | ICD-10-CM | POA: Diagnosis present

## 2016-02-29 DIAGNOSIS — Z148 Genetic carrier of other disease: Secondary | ICD-10-CM

## 2016-02-29 LAB — COMPREHENSIVE METABOLIC PANEL
ALT: 10 U/L — AB (ref 14–54)
AST: 21 U/L (ref 15–41)
Albumin: 2.7 g/dL — ABNORMAL LOW (ref 3.5–5.0)
Alkaline Phosphatase: 164 U/L — ABNORMAL HIGH (ref 38–126)
Anion gap: 6 (ref 5–15)
BUN: 10 mg/dL (ref 6–20)
CHLORIDE: 107 mmol/L (ref 101–111)
CO2: 20 mmol/L — AB (ref 22–32)
CREATININE: 0.78 mg/dL (ref 0.44–1.00)
Calcium: 9 mg/dL (ref 8.9–10.3)
GFR calc Af Amer: >60 mL/min (ref >60)
GFR calc non Af Amer: >60 mL/min (ref >60)
Glucose, Bld: 91 mg/dL (ref 65–99)
Potassium: 4 mmol/L (ref 3.5–5.1)
SODIUM: 133 mmol/L — AB (ref 135–145)
Total Bilirubin: 0.6 mg/dL (ref 0.3–1.2)
Total Protein: 6 g/dL — ABNORMAL LOW (ref 6.5–8.1)

## 2016-02-29 LAB — CBC
HEMATOCRIT: 30.2 % — AB (ref 36.0–46.0)
Hemoglobin: 10.3 g/dL — ABNORMAL LOW (ref 12.0–15.0)
MCH: 30.5 pg (ref 26.0–34.0)
MCHC: 34.1 g/dL (ref 30.0–36.0)
MCV: 89.3 fL (ref 78.0–100.0)
Platelets: 226 10*3/uL (ref 150–400)
RBC: 3.38 MIL/uL — ABNORMAL LOW (ref 3.87–5.11)
RDW: 14.2 % (ref 11.5–15.5)
WBC: 10.3 10*3/uL (ref 4.0–10.5)

## 2016-02-29 LAB — PROTEIN / CREATININE RATIO, URINE
Creatinine, Urine: 81 mg/dL
PROTEIN CREATININE RATIO: 0.23 mg/mg{creat} — AB (ref 0.00–0.15)
Total Protein, Urine: 19 mg/dL

## 2016-02-29 LAB — URIC ACID: Uric Acid, Serum: 4.6 mg/dL (ref 2.3–6.6)

## 2016-02-29 LAB — ABO/RH: ABO/RH(D): O POS

## 2016-02-29 MED ORDER — OXYTOCIN 40 UNITS IN LACTATED RINGERS INFUSION - SIMPLE MED
1.0000 m[IU]/min | INTRAVENOUS | Status: DC
  Administered 2016-02-29: 2 m[IU]/min via INTRAVENOUS

## 2016-02-29 MED ORDER — DEXTROSE 5 % IV SOLN
2.5000 10*6.[IU] | INTRAVENOUS | Status: DC
  Administered 2016-02-29 – 2016-03-01 (×6): 2.5 10*6.[IU] via INTRAVENOUS
  Filled 2016-02-29 (×9): qty 2.5

## 2016-02-29 MED ORDER — ONDANSETRON HCL 4 MG/2ML IJ SOLN
4.0000 mg | Freq: Four times a day (QID) | INTRAMUSCULAR | Status: DC | PRN
  Administered 2016-03-01: 4 mg via INTRAVENOUS
  Filled 2016-02-29: qty 2

## 2016-02-29 MED ORDER — OXYTOCIN 40 UNITS IN LACTATED RINGERS INFUSION - SIMPLE MED
2.5000 [IU]/h | INTRAVENOUS | Status: DC
  Filled 2016-02-29: qty 1000

## 2016-02-29 MED ORDER — PENICILLIN G POTASSIUM 5000000 UNITS IJ SOLR
5.0000 10*6.[IU] | Freq: Once | INTRAVENOUS | Status: AC
  Administered 2016-02-29: 5 10*6.[IU] via INTRAVENOUS
  Filled 2016-02-29: qty 5

## 2016-02-29 MED ORDER — ACETAMINOPHEN 325 MG PO TABS
650.0000 mg | ORAL_TABLET | ORAL | Status: DC | PRN
Start: 2016-02-29 — End: 2016-03-01

## 2016-02-29 MED ORDER — SOD CITRATE-CITRIC ACID 500-334 MG/5ML PO SOLN
30.0000 mL | ORAL | Status: DC | PRN
  Administered 2016-03-01: 30 mL via ORAL
  Filled 2016-02-29: qty 15

## 2016-02-29 MED ORDER — LACTATED RINGERS IV SOLN
INTRAVENOUS | Status: DC
  Administered 2016-02-29 – 2016-03-01 (×4): via INTRAVENOUS

## 2016-02-29 MED ORDER — OXYTOCIN BOLUS FROM INFUSION: 500.0000 mL | Freq: Once | INTRAVENOUS | Status: DC

## 2016-02-29 MED ORDER — LACTATED RINGERS IV SOLN: 500.0000 mL | INTRAVENOUS | Status: DC | PRN

## 2016-02-29 MED ORDER — LIDOCAINE HCL (PF) 1 % IJ SOLN: 30.0000 mL | INTRAMUSCULAR | Status: DC | PRN

## 2016-02-29 MED ORDER — LABETALOL HCL 100 MG PO TABS
50.0000 mg | ORAL_TABLET | Freq: Two times a day (BID) | ORAL | Status: DC
  Administered 2016-02-29 – 2016-03-01 (×2): 50 mg via ORAL
  Filled 2016-02-29 (×4): qty 0.5

## 2016-02-29 MED ORDER — TERBUTALINE SULFATE 1 MG/ML IJ SOLN: 0.2500 mg | Freq: Once | INTRAMUSCULAR | Status: DC | PRN

## 2016-02-29 NOTE — H&P (Addendum)
Regina Stevens is a 35 y.o. female presenting for SROM clear fluid and early labor.  [facility].  GBS+.  And IVF pregnancy OB History    Gravida Para Term Preterm AB Living   4       3     SAB TAB Ectopic Multiple Live Births   3             Past Medical History:  Diagnosis Date  . Allergy   . Asthma    2006 hospitalization  . Atopic dermatitis   . Influenza vaccine refused 10/14  . PCOS (polycystic ovarian syndrome)   . Pneumococcal vaccine refused    10/14  . Routine gynecological examination 10/13   Dr. Marcelle [facility]lieHolland, [facility]  . Spontaneous abortion 2006  . Wears glasses   . Wears partial dentures    Past Surgical History:  Procedure Laterality Date  . CHOLECYSTECTOMY    . WISDOM TOOTH EXTRACTION     Family History: family history includes Angina in her mother; Asthma in her other; Cancer in her maternal grandmother and paternal uncle; Heart disease in her maternal grandmother; Hypertension in her mother. Social History:  reports that she has never smoked. She has never used smokeless tobacco. She reports that she does not drink alcohol or use drugs.     Maternal Diabetes: No Genetic Screening: Normal Maternal Ultrasounds/Referrals: Normal Fetal Ultrasounds or other Referrals:  None Maternal Substance Abuse:  No Significant Maternal Medications:  None labetalol Significant Maternal Lab Results:  None Other Comments:  None  ROS History Dilation: 1 Effacement (%): 70 Station: -3 Exam by:: Ginger Morris RN Blood pressure 140/85, pulse 88, temperature 98.6 F (37 C), temperature source Axillary, resp. rate 18, height 5' (1.524 m), weight 182 lb (82.6 kg), last menstrual period 06/13/2015. Exam Physical Exam  Prenatal labs: ABO, Rh: --/--/O POS, O POS (08/13 1710) Antibody: NEG (08/13 1710) Rubella: Immune (01/31 0000) RPR: Nonreactive (01/31 0000)  HBsAg: Negative (01/31 0000)  HIV: Non-reactive (01/31 0000)  GBS:      Assessment/Plan: IUP at term SROM with early labor.  Discussed with patient augmentation of labor with pitocin and they agree IV PCN for GBS CHTN - controlled on labetalol 50 mg bid  Normal PI[facility] labs   Regina Stevens C 02/29/2016, 7:48 PM

## 2016-02-29 NOTE — MAU Note (Signed)
States started leaking about ago.  Cont to leak clear fluid. No bleeding. Not contracting.

## 2016-02-29 NOTE — Progress Notes (Signed)
Dr Rana SnareLowe notified of pt's VE, ROM, orders received to admit pt

## 2016-02-29 NOTE — MAU Note (Signed)
Notified Dr. Rana SnareLowe BP readings 138/93 and 142/106 order received for [facility] labs.

## 2016-02-29 NOTE — MAU Note (Signed)
Pt presents to MAU with complaints of rupture of membranes around 2 o'clock today. Denies any vaginal bleeding or contractions

## 2016-02-29 NOTE — Anesthesia Pain Management Evaluation Note (Signed)
  [facility] Pain Management Visit Note  Patient: Regina Stevens, 35 y.o., female  "Hello [facility]. We have an anesthesia team available at all times to provide care throughout the hospital, including epidural management and anesthesia for C-section. I don't know your plan for the delivery whether it a natural birth, water birth, IV sedation, nitrous supplementation, doula or epidural, but we want to meet your pain goals."   1.Was your pain managed to your expectations on prior hospitalizations?   No prior hospitalizations  2.What is your expectation for pain management during this hospitalization?     Epidural  3.How can we help you reach that goal? epidural  Record the patient's initial score and the patient's pain goal.   Pain: 0  Pain Goal: 8 [facility] wants you to be able to say your pain was always managed very well.  Willow Shidler 02/29/2016

## 2016-03-01 ENCOUNTER — Encounter (HOSPITAL_COMMUNITY)
Admission: AD | Disposition: A | Discharge status: Home / Self Care | Payer: Self-pay | Source: Ambulatory Visit | Attending: Obstetrics and Gynecology

## 2016-03-01 ENCOUNTER — Inpatient Hospital Stay (HOSPITAL_COMMUNITY): Payer: BLUE CROSS/BLUE SHIELD | Admitting: Anesthesiology

## 2016-03-01 ENCOUNTER — Encounter (HOSPITAL_COMMUNITY): Payer: Self-pay | Admitting: General Practice

## 2016-03-01 LAB — PREPARE RBC (CROSSMATCH)

## 2016-03-01 LAB — CBC
HCT: 30.2 % — ABNORMAL LOW (ref 36.0–46.0)
Hemoglobin: 10.1 g/dL — ABNORMAL LOW (ref 12.0–15.0)
MCH: 29.9 pg (ref 26.0–34.0)
MCHC: 33.4 g/dL (ref 30.0–36.0)
MCV: 89.3 fL (ref 78.0–100.0)
PLATELETS: 227 10*3/uL (ref 150–400)
RBC: 3.38 MIL/uL — AB (ref 3.87–5.11)
RDW: 14.2 % (ref 11.5–15.5)
WBC: 13.9 10*3/uL — ABNORMAL HIGH (ref 4.0–10.5)

## 2016-03-01 LAB — RPR: RPR: NONREACTIVE

## 2016-03-01 SURGERY — Surgical Case
Anesthesia: Epidural

## 2016-03-01 MED ORDER — FENTANYL CITRATE (PF) 100 MCG/2ML IJ SOLN
100.0000 ug | INTRAMUSCULAR | Status: DC | PRN
  Administered 2016-03-01 (×2): 100 ug via INTRAVENOUS
  Filled 2016-03-01 (×2): qty 2

## 2016-03-01 MED ORDER — LACTATED RINGERS IV SOLN
INTRAVENOUS | Status: DC | PRN
  Administered 2016-03-01: 21:00:00 via INTRAVENOUS

## 2016-03-01 MED ORDER — EPHEDRINE 5 MG/ML INJ: 10.0000 mg | INTRAVENOUS | Status: DC | PRN

## 2016-03-01 MED ORDER — SCOPOLAMINE 1 MG/3DAYS TD PT72
MEDICATED_PATCH | TRANSDERMAL | Status: DC | PRN
  Administered 2016-03-01: 1 via TRANSDERMAL

## 2016-03-01 MED ORDER — PHENYLEPHRINE 40 MCG/ML (10ML) SYRINGE FOR IV PUSH (FOR BLOOD PRESSURE SUPPORT)
80.0000 ug | PREFILLED_SYRINGE | INTRAVENOUS | Status: DC | PRN
Start: 2016-03-01 — End: 2016-03-01
  Filled 2016-03-01: qty 10

## 2016-03-01 MED ORDER — KETOROLAC TROMETHAMINE 30 MG/ML IJ SOLN: 30.0000 mg | Freq: Four times a day (QID) | INTRAMUSCULAR | Status: AC | PRN

## 2016-03-01 MED ORDER — PROMETHAZINE HCL 25 MG/ML IJ SOLN
INTRAMUSCULAR | Status: AC
  Filled 2016-03-01: qty 1

## 2016-03-01 MED ORDER — FENTANYL CITRATE (PF) 100 MCG/2ML IJ SOLN: 25.0000 ug | INTRAMUSCULAR | Status: DC | PRN

## 2016-03-01 MED ORDER — PROMETHAZINE HCL 25 MG/ML IJ SOLN
INTRAMUSCULAR | Status: DC | PRN
  Administered 2016-03-01: 6.25 mg via INTRAVENOUS

## 2016-03-01 MED ORDER — FENTANYL CITRATE (PF) 100 MCG/2ML IJ SOLN
INTRAMUSCULAR | Status: DC | PRN
  Administered 2016-03-01: 100 ug via EPIDURAL

## 2016-03-01 MED ORDER — DEXAMETHASONE SODIUM PHOSPHATE 4 MG/ML IJ SOLN
INTRAMUSCULAR | Status: AC
  Filled 2016-03-01: qty 1

## 2016-03-01 MED ORDER — METOCLOPRAMIDE HCL 5 MG/ML IJ SOLN
INTRAMUSCULAR | Status: DC | PRN
  Administered 2016-03-01: 10 mg via INTRAVENOUS

## 2016-03-01 MED ORDER — METOCLOPRAMIDE HCL 5 MG/ML IJ SOLN
INTRAMUSCULAR | Status: AC
  Filled 2016-03-01: qty 2

## 2016-03-01 MED ORDER — KETOROLAC TROMETHAMINE 30 MG/ML IJ SOLN
INTRAMUSCULAR | Status: AC
  Administered 2016-03-01: 30 mg via INTRAVENOUS
  Filled 2016-03-01: qty 1

## 2016-03-01 MED ORDER — SODIUM CHLORIDE 0.9 % IV SOLN: Freq: Once | INTRAVENOUS | Status: DC

## 2016-03-01 MED ORDER — LACTATED RINGERS IV SOLN: 500.0000 mL | Freq: Once | INTRAVENOUS | Status: DC

## 2016-03-01 MED ORDER — CEFAZOLIN SODIUM-DEXTROSE 2-3 GM-% IV SOLR
INTRAVENOUS | Status: DC | PRN
  Administered 2016-03-01: 2 g via INTRAVENOUS

## 2016-03-01 MED ORDER — FENTANYL 2.5 MCG/ML BUPIVACAINE 1/10 % EPIDURAL INFUSION (WH - ANES)
14.0000 mL/h | INTRAMUSCULAR | Status: DC | PRN
  Administered 2016-03-01: 12 mL/h via EPIDURAL
  Administered 2016-03-01: 14 mL/h via EPIDURAL
  Filled 2016-03-01 (×2): qty 125

## 2016-03-01 MED ORDER — PROMETHAZINE HCL 25 MG/ML IJ SOLN: 6.2500 mg | INTRAMUSCULAR | Status: DC | PRN

## 2016-03-01 MED ORDER — ONDANSETRON HCL 4 MG/2ML IJ SOLN
INTRAMUSCULAR | Status: AC
  Filled 2016-03-01: qty 2

## 2016-03-01 MED ORDER — OXYTOCIN 10 UNIT/ML IJ SOLN
INTRAMUSCULAR | Status: AC
  Filled 2016-03-01: qty 4

## 2016-03-01 MED ORDER — FENTANYL CITRATE (PF) 100 MCG/2ML IJ SOLN
INTRAMUSCULAR | Status: AC
  Filled 2016-03-01: qty 2

## 2016-03-01 MED ORDER — SODIUM BICARBONATE 8.4 % IV SOLN
INTRAVENOUS | Status: AC
  Filled 2016-03-01: qty 50

## 2016-03-01 MED ORDER — ONDANSETRON HCL 4 MG/2ML IJ SOLN
INTRAMUSCULAR | Status: DC | PRN
  Administered 2016-03-01: 4 mg via INTRAVENOUS

## 2016-03-01 MED ORDER — MEPERIDINE HCL 25 MG/ML IJ SOLN: 6.2500 mg | INTRAMUSCULAR | Status: DC | PRN

## 2016-03-01 MED ORDER — EPHEDRINE SULFATE 50 MG/ML IJ SOLN
INTRAMUSCULAR | Status: DC | PRN
  Administered 2016-03-01: 10 mg via INTRAVENOUS

## 2016-03-01 MED ORDER — LIDOCAINE-EPINEPHRINE (PF) 2 %-1:200000 IJ SOLN
INTRAMUSCULAR | Status: AC
  Filled 2016-03-01: qty 20

## 2016-03-01 MED ORDER — SCOPOLAMINE 1 MG/3DAYS TD PT72
MEDICATED_PATCH | TRANSDERMAL | Status: AC
  Filled 2016-03-01: qty 1

## 2016-03-01 MED ORDER — SODIUM BICARBONATE 8.4 % IV SOLN
INTRAVENOUS | Status: DC | PRN
  Administered 2016-03-01 (×4): 5 mL via EPIDURAL

## 2016-03-01 MED ORDER — LIDOCAINE HCL (PF) 1 % IJ SOLN
INTRAMUSCULAR | Status: DC | PRN
  Administered 2016-03-01 (×2): 4 mL

## 2016-03-01 MED ORDER — PHENYLEPHRINE 40 MCG/ML (10ML) SYRINGE FOR IV PUSH (FOR BLOOD PRESSURE SUPPORT)
PREFILLED_SYRINGE | INTRAVENOUS | Status: AC
  Filled 2016-03-01: qty 10

## 2016-03-01 MED ORDER — EPHEDRINE 5 MG/ML INJ
INTRAVENOUS | Status: AC
  Filled 2016-03-01: qty 10

## 2016-03-01 MED ORDER — LACTATED RINGERS IV SOLN
INTRAVENOUS | Status: DC | PRN
  Administered 2016-03-01 (×2): via INTRAVENOUS

## 2016-03-01 MED ORDER — PHENYLEPHRINE 40 MCG/ML (10ML) SYRINGE FOR IV PUSH (FOR BLOOD PRESSURE SUPPORT): 80.0000 ug | PREFILLED_SYRINGE | INTRAVENOUS | Status: DC | PRN

## 2016-03-01 MED ORDER — OXYTOCIN 10 UNIT/ML IJ SOLN
INTRAMUSCULAR | Status: DC | PRN
  Administered 2016-03-01: 40 [IU] via INTRAVENOUS

## 2016-03-01 MED ORDER — DEXAMETHASONE SODIUM PHOSPHATE 4 MG/ML IJ SOLN
INTRAMUSCULAR | Status: DC | PRN
  Administered 2016-03-01: 4 mg via INTRAVENOUS

## 2016-03-01 MED ORDER — PHENYLEPHRINE HCL 10 MG/ML IJ SOLN
INTRAMUSCULAR | Status: DC | PRN
  Administered 2016-03-01 (×2): 100 ug via INTRAVENOUS

## 2016-03-01 MED ORDER — KETOROLAC TROMETHAMINE 30 MG/ML IJ SOLN
30.0000 mg | Freq: Four times a day (QID) | INTRAMUSCULAR | Status: AC | PRN
  Administered 2016-03-01: 30 mg via INTRAVENOUS

## 2016-03-01 MED ORDER — DIPHENHYDRAMINE HCL 50 MG/ML IJ SOLN: 12.5000 mg | INTRAMUSCULAR | Status: DC | PRN

## 2016-03-01 SURGICAL SUPPLY — 33 items
BENZOIN TINCTURE PRP APPL 2/3 (GAUZE/BANDAGES/DRESSINGS) ×3 IMPLANT
CHLORAPREP W/TINT 26ML (MISCELLANEOUS) ×3 IMPLANT
CLAMP CORD UMBIL (MISCELLANEOUS) IMPLANT
CLOSURE STERI STRIP 1/2 X4 (GAUZE/BANDAGES/DRESSINGS) ×3 IMPLANT
CLOTH BEACON ORANGE TIMEOUT ST (SAFETY) ×3 IMPLANT
DRSG OPSITE POSTOP 4X10 (GAUZE/BANDAGES/DRESSINGS) ×3 IMPLANT
ELECT REM PT RETURN 9FT ADLT (ELECTROSURGICAL) ×3
ELECTRODE REM PT RTRN 9FT ADLT (ELECTROSURGICAL) ×1 IMPLANT
EXTRACTOR VACUUM M CUP 4 TUBE (SUCTIONS) IMPLANT
EXTRACTOR VACUUM M CUP 4' TUBE (SUCTIONS)
GAUZE SPONGE 4X4 12PLY STRL LF (GAUZE/BANDAGES/DRESSINGS) ×6 IMPLANT
GLOVE BIOGEL PI IND STRL 7.0 (GLOVE) ×1 IMPLANT
GLOVE BIOGEL PI INDICATOR 7.0 (GLOVE) ×2
GLOVE SURG ORTHO 8.0 STRL STRW (GLOVE) ×3 IMPLANT
GOWN STRL REUS W/TWL LRG LVL3 (GOWN DISPOSABLE) ×6 IMPLANT
KIT ABG SYR 3ML LUER SLIP (SYRINGE) ×3 IMPLANT
NEEDLE HYPO 25X5/8 SAFETYGLIDE (NEEDLE) ×3 IMPLANT
NS IRRIG 1000ML POUR BTL (IV SOLUTION) ×3 IMPLANT
PACK C SECTION WH (CUSTOM PROCEDURE TRAY) ×3 IMPLANT
PAD ABD 7.5X8 STRL (GAUZE/BANDAGES/DRESSINGS) ×3 IMPLANT
PAD OB MATERNITY 4.3X12.25 (PERSONAL CARE ITEMS) ×3 IMPLANT
PENCIL SMOKE EVAC W/HOLSTER (ELECTROSURGICAL) ×3 IMPLANT
SUT MNCRL 0 VIOLET CTX 36 (SUTURE) ×3 IMPLANT
SUT MON AB 4-0 PS1 27 (SUTURE) ×3 IMPLANT
SUT MONOCRYL 0 CTX 36 (SUTURE) ×6
SUT PDS AB 1 CT  36 (SUTURE)
SUT PDS AB 1 CT 36 (SUTURE) IMPLANT
SUT VIC AB 1 CTX 36 (SUTURE)
SUT VIC AB 1 CTX36XBRD ANBCTRL (SUTURE) IMPLANT
SUT VIC AB 2-0 SH 27 (SUTURE) ×2
SUT VIC AB 2-0 SH 27XBRD (SUTURE) ×1 IMPLANT
TOWEL OR 17X24 6PK STRL BLUE (TOWEL DISPOSABLE) ×3 IMPLANT
TRAY FOLEY CATH SILVER 14FR (SET/KITS/TRAYS/PACK) ×3 IMPLANT

## 2016-03-01 NOTE — Anesthesia Postprocedure Evaluation (Signed)
Anesthesia Post Note  Patient: Regina Stevens, product/process developmentaresa Fortson-Fuller  Procedure(s) Performed: Procedure(s) (LRB): CESAREAN SECTION (N/A)  Patient location during evaluation: PACU Anesthesia Type: MAC and Epidural Level of consciousness: awake and alert Pain management: pain level controlled Vital Signs Assessment: post-procedure vital signs reviewed and stable Respiratory status: spontaneous breathing Cardiovascular status: stable Postop Assessment: epidural receding Anesthetic complications: no     Last Vitals:  Vitals:   03/01/16 2300 03/01/16 2315  BP: (!) 150/100 (!) 139/94  Pulse: 100 94  Resp: 18 19  Temp: 36.6 C     Last Pain:  Vitals:   03/01/16 2315  TempSrc:   PainSc: 0-No pain   Pain Goal: Patients Stated Pain Goal: 8 (02/29/16 1923)               Lewie LoronJohn Byron Tipping

## 2016-03-01 NOTE — Anesthesia Procedure Notes (Signed)
Epidural Patient location during procedure: OB  Staffing Anesthesiologist: Karie SchwalbeJUDD, Regina Stevens Performed: anesthesiologist   Preanesthetic Checklist Completed: patient identified, site marked, surgical consent, pre-op evaluation, timeout performed, IV checked, risks and benefits discussed and monitors and equipment checked  Epidural Patient position: sitting Prep: ChloraPrep and site prepped and draped Patient monitoring: continuous pulse ox and blood pressure Approach: midline Location: L3-L4 Injection technique: LOR saline  Needle:  Needle type: Tuohy  Needle gauge: 17 G Needle length: 9 cm and 9 Needle insertion depth: 7 cm Catheter type: closed end flexible Catheter size: 19 Gauge Catheter at skin depth: 12 cm Test dose: negative  Assessment Events: blood not aspirated, injection not painful, no injection resistance, negative IV test and no paresthesia  Additional Notes Patient identified. Risks/Benefits/Options discussed with patient including but not limited to bleeding, infection, nerve damage, paralysis, failed block, incomplete pain control, headache, blood pressure changes, nausea, vomiting, reactions to medication both or allergic, itching and postpartum back pain. Confirmed with bedside nurse the patient's most recent platelet count. Confirmed with patient that they are not currently taking any anticoagulation, have any bleeding history or any family history of bleeding disorders. Patient expressed understanding and wished to proceed. All questions were answered. Sterile technique was used throughout the entire procedure. Please see nursing notes for vital signs. Test dose was given through epidural catheter and negative prior to continuing to dose epidural or start infusion. Warning signs of high block given to the patient including shortness of breath, tingling/numbness in hands, complete motor block, or any concerning symptoms with instructions to call for help. Patient was  given instructions on fall risk and not to get out of bed. All questions and concerns addressed with instructions to call with any issues or inadequate analgesia.

## 2016-03-01 NOTE — Progress Notes (Signed)
Patient ID: Regina Stevens, female   DOB: [birth year], 35 y.o.   MRN: [id number] S: No complaints. Comf w/cle.   O:  Vitals:   03/01/16 1131 03/01/16 1135 03/01/16 1140 03/01/16 1145  BP: 125/72     Pulse: 73 87 79 79  Resp:      Temp:      TempSrc:      SpO2:  100% 100% 100%  Weight:      Height:       Gen: NAD SVE: 3.5/90/-2 FHT: 110/mod var/no acc/occ late decels Toco: IUPC placed. Ctxn q 4-5 min  A/P: Pt is a 35 yo G4P0 here with SROM, in latent labor. # Labor/FWB:  IUPC placed for difficult tracing ctxn and questionable decels. S/p prolong x 6 min that resolved with pitocin being shut off. Pitocin restarted 1 hour later. Now w/IUPC and IFE, appears tracing is cat 2 with occ subtle late decels. Position changed. FHT with mod var and overall reassuring. Cont pitocin and titrate prn.  Pelvis with prominent pubic arch and somewhat narrow. However, fetal EFW 7#s and should be appropriate for vaginal delivery.   # h/o cHTN and migraines, on lab 50 BID. Bps remain normal, will not repeat PIH labs at this time. Baseline on admission WNL.  # GBS pos - PCN per protocol  Belva AgeeElise Aziya Arena MD

## 2016-03-01 NOTE — Anesthesia Rounding Note (Signed)
  CRNA Epidural Rounding Note  Patient: Regina Stevens, 35 y.o., female  Patient's current pain level: 1  Agreed upon pain management level: 4  Epidural intervention: No   Comments:   [facility] S[facility]icenterEIGHT,Jeffey Janssen 03/01/2016

## 2016-03-01 NOTE — Transfer of Care (Signed)
Immediate Anesthesia Transfer of Care Note  Patient: Regina Stevens  Procedure(s) Performed: Procedure(s): CESAREAN SECTION (N/A)  Patient Location: PACU  Anesthesia Type:Epidural  Level of Consciousness: awake, alert , oriented and patient cooperative  Airway & Oxygen Therapy: Patient Spontanous Breathing  Post-op Assessment: Report given to RN and Post -op Vital signs reviewed and stable  Post vital signs: Reviewed and stable  Last Vitals:  Vitals:   03/01/16 2001 03/01/16 2034  BP: 137/87 (!) 159/96  Pulse: 81 94  Resp: 18 18  Temp:      Last Pain:  Vitals:   03/01/16 1936  TempSrc:   PainSc: 0-No pain      Patients Stated Pain Goal: 8 (02/29/16 1923)  Complications: No apparent anesthesia complications

## 2016-03-01 NOTE — Progress Notes (Signed)
Patient ID: Estrella Deedsaresa Fortson-Fuller, female   DOB: 1981/03/13, 35 y.o.   MRN: [id number] Late entry. Pt seen at 1800.  Patient feeling tired, uncomf in high fowlers.  FHT 110s, mod var, occ early/lates, cat 2 tracing. SVE unchanged 3.5cm - cervix swollen, head molded and remains high in the pelvis. Pitocin at 14 with inadequate pattern.  Long d/w patient regarding SROM > 24 hours, minimal cervical change, head molding, prominent pubic arch, and inability to achieve adequate ctxn pattern with pitocin s/s late decelerations.  Plan to re-SVE in two hours. If no change, plan for pLTCS. Patient and husband in agreement.   Belva AgeeElise Yetzali Weld MD

## 2016-03-01 NOTE — Anesthesia Preprocedure Evaluation (Signed)
Anesthesia Evaluation  Patient identified by MRN, date of birth, ID band Patient awake    Reviewed: Allergy & Precautions, NPO status , Patient's Chart, lab work & pertinent test results  History of Anesthesia Complications Negative for: history of anesthetic complications  Airway Mallampati: III  TM Distance: >3 FB Neck ROM: Full    Dental no notable dental hx. (+) Dental Advisory Given, Partial Upper   Pulmonary asthma ,    Pulmonary exam normal breath sounds clear to auscultation       Cardiovascular negative cardio ROS Normal cardiovascular exam Rhythm:Regular Rate:Normal     Neuro/Psych  Headaches, negative psych ROS   GI/Hepatic negative GI ROS, Neg liver ROS,   Endo/Other  obesity  Renal/GU negative Renal ROS  negative genitourinary   Musculoskeletal negative musculoskeletal ROS (+)   Abdominal   Peds negative pediatric ROS (+)  Hematology negative hematology ROS (+)   Anesthesia Other Findings   Reproductive/Obstetrics (+) Pregnancy                             Anesthesia Physical Anesthesia Plan  ASA: II  Anesthesia Plan: Epidural   Post-op Pain Management:    Induction:   Airway Management Planned:   Additional Equipment:   Intra-op Plan:   Post-operative Plan:   Informed Consent: I have reviewed the patients History and Physical, chart, labs and discussed the procedure including the risks, benefits and alternatives for the proposed anesthesia with the patient or authorized representative who has indicated his/her understanding and acceptance.   Dental advisory given  Plan Discussed with: CRNA  Anesthesia Plan Comments:         Anesthesia Quick Evaluation

## 2016-03-01 NOTE — Progress Notes (Signed)
Patient ID: Estrella Deedsaresa Fortson-Fuller, female   DOB: Nov 19, 1980, 35 y.o.   MRN: [id number]  S: Painful ctxn, desire cle.   O:  Vitals:   03/01/16 0825 03/01/16 0826  BP: 128/74   Pulse: 92 93  Resp:    Temp:     Gen: NAD SVE: 3/90/-2 FHT: 110/mod var/+acc/no dec Toco: q 2 min  A/P: Pt is a 35 yo G4P0 here with SROM, in latent labor  # Labor - latent, cont pit titration, currently at 8 # MWB   - h/o cHTN and migraines, on lab 50 BID. Has a few isolated severe range Bps during cle, resolved, normotensive. Send PIH labs if persistent severe range Bps. CTM closely for s/s of PIH.  # FWB - cat 1 tracing, reassuring # GBS pos - PCN per protocol  Belva AgeeElise Azarius Lambson MD

## 2016-03-01 NOTE — Op Note (Signed)
PROCEDURE DATE: 03/01/2016  PREOPERATIVE DIAGNOSIS: Intrauterine pregnancy at 7626w3d weeks gestation, arrest of dilation remote from delivery w/SROM>30 hours  POSTOPERATIVE DIAGNOSIS:The same  PROCEDURE: Low TransverseCesarean Section  SURGEON: Dr. Belva AgeeElise Kassidie Hendriks  INDICATIONS:Regina Cathlean MarseillesFortson-Fuller is a35 y.o.G4P0 at 6526w3d scheduled for cesarean section secondary to arrest of dilation at 3cm.She s/p SROM at 1400 yesterday. Despite aggressive pitocin titration, unable to reach adequate ctxn pattern and SVE unchanged from ~3cm for greater than 12 hours. At last two SVEs, cervix swollen and fetal head high with ~3cm of caput. Decision made to proceed with pLTCS.The risks of cesarean section discussed with the patient included but were not limited to: bleeding which may require transfusion or reoperation; infection which may require antibiotics; injury to bowel, bladder, ureters or other surrounding organs; injury to the fetus; need for additional procedures including hysterectomy in the event of a life-threatening hemorrhage; placental abnormalities wth subsequent pregnancies, incisional problems, thromboembolic phenomenon and other postoperative/anesthesia complications. The patient concurred with the proposed plan, giving informed written consent for the procedure.   FINDINGS: Viable maleinfant in cephalic presentation,APGARs 8,9: Weight pending  Clearscant amniotic fluid. Intact placenta, three vessel cord. Grossly normal uterus, ovaries and fallopian tubes. .  ANESTHESIA: Epidural ESTIMATED BLOOD LOSS: 1000ml SPECIMENS: Placenta for L&D COMPLICATIONS: None immediate  PROCEDURE IN DETAIL: The patient received intravenous antibiotics (2g Ancef) and had sequential compression devices applied to her lower extremities while in the preoperative area. Shewasthen taken to the operating roomwhere epidural anesthesiawas dosed up to surgical level andwas found to be  adequate. She was then placed in a dorsal supine position with a leftward tilt,and prepped and draped in a sterile manner.A foley catheter was placed into her bladder and attached to constant gravity. After an adequate timeout was performed, aPfannenstiel skin incision was made with scalpel and carried through to the underlying layer of fascia. The fascia was incised in the midline and this incision was extended bilaterally using the Mayo scissors. Kocher clamps were applied to the superior aspect of the fascial incision and the underlying rectus muscles were dissected off bluntly. A similar process was carried out on the inferior aspect of the facial incision. The rectus muscles were separated in the midline bluntly and the peritoneum was entered bluntly. A bladder flap was created sharply and developed bluntly.Atransverse hysterotomy was made with a scalpel and extended bilaterally bluntly. The bladder blade was then removed. The infant was successfully delivered, and cord was clamped and cut and infant was handed over to awaiting neonatology team. Uterine massage was then administered and the placenta delivered intact with three-vessel cord. Cord gases were taken. The uterus was cleared of clot and debris. The hysterotomy was closed with 0 vicryl. A second imbricating suture of 0-vicryl was used to reinforce the incision and aid in hemostasis. The uterus was denuded on the left lower uterine segment superficially and was sewn over using 2/0 vicryl in a figure of eight. The fascia was closed with 0-Vicryl in a running fashion with good restoration of anatomy. The subcutaneus tissue was irrigated and was reapproximated using three interrupted plain gut stitches. The skin was closed with 4-0 Vicryl in a subcuticular fashion.  Final EBL was 1,000cc (all surgical site and was hemostatic at end of procedure) without any further bleeding on exam.   Pt tolerated the procedure well. All  sponge/lap/needle counts were correct  X 2. Pt taken to recovery room in stable condition.   Belva AgeeElise Jamiria Langill MD

## 2016-03-01 NOTE — Progress Notes (Signed)
Patient ID: Regina Stevens, female   DOB: 06-07-1981, 35 y.o.   MRN: [id number]  Tracing improved s/p re-positioning. Cat 1 tracing. - baseline 120s/mod var/+acc/occ early decels. IUPC in place w/out adequate ctn pattern. Will cont to increase pitocin, currently at 14, to achieve adequate pattern. Plan to re-SVE ~6-8pm.  Rosie FateE Arun Herrod MD

## 2016-03-02 ENCOUNTER — Encounter (HOSPITAL_COMMUNITY): Payer: Self-pay | Admitting: Obstetrics and Gynecology

## 2016-03-02 LAB — CBC
HCT: 23.2 % — ABNORMAL LOW (ref 36.0–46.0)
Hemoglobin: 8.1 g/dL — ABNORMAL LOW (ref 12.0–15.0)
MCH: 30.5 pg (ref 26.0–34.0)
MCHC: 34.9 g/dL (ref 30.0–36.0)
MCV: 87.2 fL (ref 78.0–100.0)
PLATELETS: 179 10*3/uL (ref 150–400)
RBC: 2.66 MIL/uL — ABNORMAL LOW (ref 3.87–5.11)
RDW: 14.3 % (ref 11.5–15.5)
WBC: 14.4 10*3/uL — ABNORMAL HIGH (ref 4.0–10.5)

## 2016-03-02 MED ORDER — SCOPOLAMINE 1 MG/3DAYS TD PT72
1.0000 | MEDICATED_PATCH | Freq: Once | TRANSDERMAL | Status: DC
  Filled 2016-03-02: qty 1

## 2016-03-02 MED ORDER — SENNOSIDES-DOCUSATE SODIUM 8.6-50 MG PO TABS
2.0000 | ORAL_TABLET | ORAL | Status: DC
  Administered 2016-03-03: 2 via ORAL
  Filled 2016-03-02: qty 2

## 2016-03-02 MED ORDER — TETANUS-DIPHTH-ACELL PERTUSSIS 5-2.5-18.5 LF-MCG/0.5 IM SUSP: 0.5000 mL | Freq: Once | INTRAMUSCULAR | Status: DC

## 2016-03-02 MED ORDER — HYDROXYZINE HCL 25 MG PO TABS
25.0000 mg | ORAL_TABLET | ORAL | Status: DC | PRN
  Administered 2016-03-02 – 2016-03-03 (×2): 25 mg via ORAL
  Filled 2016-03-02 (×5): qty 1

## 2016-03-02 MED ORDER — SIMETHICONE 80 MG PO CHEW
80.0000 mg | CHEWABLE_TABLET | ORAL | Status: DC
  Administered 2016-03-03: 80 mg via ORAL
  Filled 2016-03-02: qty 1

## 2016-03-02 MED ORDER — DIBUCAINE 1 % RE OINT: 1.0000 "application " | TOPICAL_OINTMENT | RECTAL | Status: DC | PRN

## 2016-03-02 MED ORDER — COCONUT OIL OIL: 1.0000 "application " | TOPICAL_OIL | Status: DC | PRN

## 2016-03-02 MED ORDER — NALOXONE HCL 2 MG/2ML IJ SOSY
1.0000 ug/kg/h | PREFILLED_SYRINGE | INTRAVENOUS | Status: DC | PRN
  Filled 2016-03-02: qty 2

## 2016-03-02 MED ORDER — ZOLPIDEM TARTRATE 5 MG PO TABS
5.0000 mg | ORAL_TABLET | Freq: Every evening | ORAL | Status: DC | PRN
Start: 2016-03-02 — End: 2016-03-03

## 2016-03-02 MED ORDER — SIMETHICONE 80 MG PO CHEW
80.0000 mg | CHEWABLE_TABLET | Freq: Three times a day (TID) | ORAL | Status: DC
  Administered 2016-03-02 – 2016-03-03 (×3): 80 mg via ORAL
  Filled 2016-03-02 (×3): qty 1

## 2016-03-02 MED ORDER — MENTHOL 3 MG MT LOZG: 1.0000 | LOZENGE | OROMUCOSAL | Status: DC | PRN

## 2016-03-02 MED ORDER — ONDANSETRON HCL 4 MG/2ML IJ SOLN: 4.0000 mg | Freq: Three times a day (TID) | INTRAMUSCULAR | Status: DC | PRN

## 2016-03-02 MED ORDER — IBUPROFEN 600 MG PO TABS
600.0000 mg | ORAL_TABLET | Freq: Four times a day (QID) | ORAL | Status: DC
  Administered 2016-03-02 – 2016-03-03 (×6): 600 mg via ORAL
  Filled 2016-03-02 (×6): qty 1

## 2016-03-02 MED ORDER — ACETAMINOPHEN 325 MG PO TABS
650.0000 mg | ORAL_TABLET | ORAL | Status: DC | PRN
  Administered 2016-03-02 (×2): 650 mg via ORAL
  Filled 2016-03-02 (×2): qty 2

## 2016-03-02 MED ORDER — LABETALOL HCL 100 MG PO TABS
50.0000 mg | ORAL_TABLET | Freq: Two times a day (BID) | ORAL | Status: DC
  Administered 2016-03-02 – 2016-03-03 (×3): 50 mg via ORAL
  Filled 2016-03-02 (×3): qty 1

## 2016-03-02 MED ORDER — SIMETHICONE 80 MG PO CHEW: 80.0000 mg | CHEWABLE_TABLET | ORAL | Status: DC | PRN

## 2016-03-02 MED ORDER — SODIUM CHLORIDE 0.9% FLUSH: 3.0000 mL | INTRAVENOUS | Status: DC | PRN

## 2016-03-02 MED ORDER — LACTATED RINGERS IV SOLN
INTRAVENOUS | Status: DC
  Administered 2016-03-02: 09:00:00 via INTRAVENOUS

## 2016-03-02 MED ORDER — NALOXONE HCL 0.4 MG/ML IJ SOLN: 0.4000 mg | INTRAMUSCULAR | Status: DC | PRN

## 2016-03-02 MED ORDER — WITCH HAZEL-GLYCERIN EX PADS: 1.0000 "application " | MEDICATED_PAD | CUTANEOUS | Status: DC | PRN

## 2016-03-02 MED ORDER — OXYTOCIN 40 UNITS IN LACTATED RINGERS INFUSION - SIMPLE MED: 2.5000 [IU]/h | INTRAVENOUS | Status: AC

## 2016-03-02 MED ORDER — OXYCODONE-ACETAMINOPHEN 5-325 MG PO TABS
1.0000 | ORAL_TABLET | ORAL | Status: DC | PRN
  Administered 2016-03-02 (×2): 1 via ORAL
  Administered 2016-03-02 – 2016-03-03 (×2): 2 via ORAL
  Filled 2016-03-02: qty 1
  Filled 2016-03-02: qty 2
  Filled 2016-03-02: qty 1
  Filled 2016-03-02: qty 2

## 2016-03-02 NOTE — Lactation Note (Signed)
This note was copied from a baby's chart. Lactation Consultation Note  Baby 23 hours old and sleeping STS on mother's chest. Reviewed hand expression w/ teachback and mother happy to express drops. Answered mother's questions. Encouraged her to post pump for 10-15 min a few times a day for extra stimulation. Taught mother how to spoon feed. Encouraged her to call if she needs further assitance. Mom encouraged to feed baby 8-12 times/24 hours and with feeding cues.    Patient Name: Boy Estrella Deedsaresa Fortson-Fuller RUEAV'WToday's Date: 03/02/2016 Reason for consult: Follow-up assessment   Maternal Data    Feeding Feeding Type: Breast Fed Length of feed: 30 min  LATCH Score/Interventions                      Lactation Tools Discussed/Used     Consult Status Consult Status: Follow-up Date: 03/03/16 Follow-up type: In-patient    Dahlia ByesBerkelhammer, Ruth [facility] 03/02/2016, 8:41 PM

## 2016-03-02 NOTE — Progress Notes (Signed)
Notified Okey Regalarol NP that patient is requesting something else for pain.

## 2016-03-02 NOTE — Progress Notes (Signed)
Subjective: Postpartum Day 1: Cesarean Delivery Patient reports incisional pain and tolerating PO.    Objective: Vital signs in last 24 hours: Temp:  [97.9 F (36.6 C)-98.9 F (37.2 C)] 98.4 F (36.9 C) (08/15 0500) Pulse Rate:  [69-150] 86 (08/15 0500) Resp:  [16-20] 20 (08/15 0500) BP: (86-166)/(45-101) 140/80 (08/15 0500) SpO2:  [97 %-100 %] 98 % (08/15 0500)  Physical Exam:  General: alert and cooperative Lochia: appropriate Uterine Fundus: firm Incision: abd dressing CDI DVT Evaluation: No evidence of DVT seen on physical exam. Negative Homan's sign. No cords or calf tenderness. No significant calf/ankle edema.   Recent Labs  03/01/16 0651 03/02/16 0554  HGB 10.1* 8.1*  HCT 30.2* 23.2*    Assessment/Plan: Status post Cesarean section. Doing well postoperatively.  Continue current care.  Holland Nickson G 03/02/2016, 8:13 AM

## 2016-03-02 NOTE — Addendum Note (Signed)
Addendum  created 03/02/16 0809 by Earmon PhoenixValerie P Roselia Snipe, CRNA   Sign clinical note

## 2016-03-02 NOTE — Progress Notes (Signed)
Left a message for Okey Regalarol NP, patient requests pain medication.

## 2016-03-02 NOTE — Anesthesia Postprocedure Evaluation (Signed)
Anesthesia Post Note  Patient: Regina Stevens, Regina Stevens  Procedure(s) Performed: Procedure(s) (LRB): CESAREAN SECTION (N/A)  Patient location during evaluation: Mother Baby Level of consciousness: awake and alert Pain management: pain level controlled Vital Signs Assessment: post-procedure vital signs reviewed and stable Respiratory status: spontaneous breathing Cardiovascular status: stable Postop Assessment: no headache, no backache, epidural receding and patient able to bend at knees Anesthetic complications: no     Last Vitals:  Vitals:   03/02/16 0300 03/02/16 0500  BP: 128/88 140/80  Pulse: 88 86  Resp: 20 20  Temp: 37 C 36.9 C    Last Pain:  Vitals:   03/02/16 0500  TempSrc:   PainSc: 3    Pain Goal: Patients Stated Pain Goal: 8 (02/29/16 1923)               Edison PaceWILKERSON,[name]

## 2016-03-02 NOTE — Lactation Note (Signed)
This note was copied from a baby's chart. Lactation Consultation Note New mom c/s, baby hasn't latched yet d/t no interest. Baby is snorty, sounds nasal congestion. Has been spitty. Baby is alert. Suck training w/gloved finger, baby didn't suck, just ocassionaly bit. Mom has pendulum breast w/everted nipples. Rt. Nipple is compressible. Lt. Nipple/areola has edema, not compressible. Nipple slightly compresses inwards. Reverse pressure w/slight improvement. Generalized edema noted to body. Hand expression taught w/drop to nipple. Unable to obtain any to give to baby. 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 Appears to have breast tissue. Mom shown how to use DEBP & how to disassemble, clean, & reassemble parts.Mom knows to pump q3h for 15-20 min. Mom pumped w/o any colostrum noted. Mom encouraged to feed baby 8-12 times/24 hours and with feeding cues. Attempt, if not interested cont. To do STS, hand express and spoon feed to stimulate if able to get colostrum. Referred to Baby and Me Book in Breastfeeding section Pg. 22-23 for position options and Proper latch demonstration. Educated about newborn behavior, STS, I&O, cluster, supply and demand. WH/LC brochure given w/resources, support groups and LC services. Answered a lot of questions from new parents. Changed diaper, baby has large voids and regular mec. Stool.  Patient Name: Regina Stevens ZOXWR'UToday's Date: 03/02/2016 Reason for consult: Initial assessment   Maternal Data Has patient been taught Hand Expression?: Yes Does the patient have breastfeeding experience prior to this delivery?: No  Feeding Feeding Type: Breast Fed Length of feed: 0 min  LATCH Score/Interventions Latch: Too sleepy or reluctant, no latch achieved, no sucking elicited. Intervention(s): Skin to skin;Teach feeding cues;Waking techniques  Audible Swallowing: None  Type of Nipple: Everted at rest and after stimulation  Comfort (Breast/Nipple): Soft / non-tender     Hold  (Positioning): Full assist, staff holds infant at breast Intervention(s): Breastfeeding basics reviewed;Support Pillows;Position options;Skin to skin  LATCH Score: 4  Lactation Tools Discussed/Used Tools: Pump Breast pump type: Double-Electric Breast Pump WIC Program: No Pump Review: Setup, frequency, and cleaning;Milk Storage Initiated by:: Peri JeffersonL. Clarisse Rodriges RN IBCLC Date initiated:: 03/02/16   Consult Status Consult Status: Follow-up    Michal Callicott, Diamond NickelLAURA G 03/02/2016, 4:45 AM

## 2016-03-03 MED ORDER — HYDROCODONE-ACETAMINOPHEN 5-325 MG PO TABS: 1.0000 | ORAL_TABLET | ORAL | 0 refills | Status: DC | PRN

## 2016-03-03 MED ORDER — HYDROCODONE-ACETAMINOPHEN 5-325 MG PO TABS: 1.0000 | ORAL_TABLET | ORAL | Status: DC | PRN

## 2016-03-03 MED ORDER — IBUPROFEN 600 MG PO TABS: 600.0000 mg | ORAL_TABLET | Freq: Four times a day (QID) | ORAL | 1 refills | Status: AC

## 2016-03-03 NOTE — Progress Notes (Signed)
UR chart review completed.  

## 2016-03-03 NOTE — Discharge Summary (Signed)
Obstetric Discharge Summary Reason for Admission: rupture of membranes Prenatal Procedures: ultrasound Intrapartum Procedures: cesarean: low cervical, transverse Postpartum Procedures: none Complications-Operative and Postpartum: none Hemoglobin  Date Value Ref Range Status  03/02/2016 8.1 (L) 12.0 - 15.0 g/dL Final   HCT  Date Value Ref Range Status  03/02/2016 23.2 (L) 36.0 - 46.0 % Final    Physical Exam:  General: alert and cooperative Lochia: appropriate Uterine Fundus: firm Incision: healing well DVT Evaluation: No evidence of DVT seen on physical exam. Negative Homan's sign. No cords or calf tenderness. Calf/Ankle edema is present.  Discharge Diagnoses: Term Pregnancy-delivered  Discharge Information: Date: 03/03/2016 Activity: pelvic rest Diet: routine Medications: PNV, Ibuprofen and Vicodin Condition: stable Instructions: refer to practice specific booklet Discharge to: home   Newborn Data: Live born female  Birth Weight: 7 lb 4.6 oz (3305 g) APGAR: 8, 9  Home with mother.  CURTIS,CAROL G 03/03/2016, 8:20 AM

## 2016-03-03 NOTE — Progress Notes (Signed)
Subjective: Postpartum Day 2: Cesarean Delivery Patient reports incisional pain, tolerating PO, + flatus and no problems voiding.    Objective: Vital signs in last 24 hours: Temp:  [98.1 F (36.7 C)-98.3 F (36.8 C)] 98.1 F (36.7 C) (08/16 0529) Pulse Rate:  [82-96] 89 (08/16 0529) Resp:  [18-20] 18 (08/16 0529) BP: (125-143)/(61-77) 135/77 (08/16 0529) SpO2:  [99 %-100 %] 99 % (08/15 2202)  Physical Exam:  General: alert and cooperative Lochia: appropriate Uterine Fundus: firm Incision: healing well DVT Evaluation: No evidence of DVT seen on physical exam. Negative Homan's sign. No cords or calf tenderness. Calf/Ankle edema is present.   Recent Labs  03/01/16 0651 03/02/16 0554  HGB 10.1* 8.1*  HCT 30.2* 23.2*    Assessment/Plan: Status post Cesarean section. Doing well postoperatively.  Discharge home with standard precautions and return to clinic in 1-2 weeks.  CURTIS,CAROL G 03/03/2016, 8:08 AM

## 2016-03-03 NOTE — Lactation Note (Addendum)
This note was copied from a baby's chart. Lactation Consultation Note  Patient Name: Regina Stevens ZOXWR'UToday's Date: 03/03/2016 Reason for consult: Follow-up assessment;Infant weight loss  Baby is 9738 hours old , post  circ this am at 0930 per mom and dad, and has been sleepy.  Last fed early am. LC checked the baby's diaper , noted to be clean and dry, small dried blood  On diaper from circ. Kept the diaper for the RN to see. LC undressed the baby and placed the baby skin to skin, baby awake and rooting. LC assisted  The baby to latch in football position, noted some areola edema , improved after hand expressing And breast compressions with latch. Obtained depth with breast compressions, increased swallows noted  Breast compressions. Baby fed for 10 mins and released and fell back to sleep.  LC reviewed breast feeding basics, importance of skin to skin feedings until the baby can stay awake for feedings.  And to watch for feeding cues. Breast feeding goals to establish milk supply at least 8 plus feedings a day and not to go over  3 hours without feeding. Mom denies soreness , but due to some edema in the areola , instructed mom on the use shells,  Also hand pump until her DEBP comes in. Sore nipple and engorgement prevention and tx reviewed.  Mom has a hx of PCOS - per mom had multiply breast changes with pregnancy in the 1st trimester.  LC reassured mom it is a good sign. Able to express easily prior to latch.  Mother informed of post-discharge support and given phone number to the lactation department, including services for phone  call assistance; out-patient appointments; and breastfeeding support group. List of other breastfeeding resources in the community  given in the handout. Encouraged mother to call for problems or concerns related to breastfeeding.   Maternal Data Has patient been taught Hand Expression?: Yes  Feeding Feeding Type: Breast Fed (right breast / football  ) Length of feed: 10 min (several swallows noted )  LATCH Score/Interventions Latch: Grasps breast easily, tongue down, lips flanged, rhythmical sucking. Intervention(s): Skin to skin;Teach feeding cues;Waking techniques  Audible Swallowing: Spontaneous and intermittent  Type of Nipple: Everted at rest and after stimulation (some areola edema )  Comfort (Breast/Nipple): Soft / non-tender     Hold (Positioning): Assistance needed to correctly position infant at breast and maintain latch. Intervention(s): Breastfeeding basics reviewed;Support Pillows;Position options;Skin to skin  LATCH Score: 9  Lactation Tools Discussed/Used Tools: Shells;Pump Shell Type: Inverted Breast pump type: Manual   Consult Status Consult Status: Complete Date: 03/03/16 Follow-up type: In-patient    Kathrin Greathouseorio, [name] 03/03/2016, 12:02 PM

## 2016-03-04 ENCOUNTER — Telehealth: Payer: Self-pay | Admitting: Medical

## 2016-03-04 LAB — TYPE AND SCREEN
ABO/RH(D): O POS
ANTIBODY SCREEN: NEGATIVE
UNIT DIVISION: 0

## 2016-03-04 NOTE — Telephone Encounter (Signed)
Please call on behalf of me and our office to congratulate them on their new baby

## 2016-03-05 ENCOUNTER — Encounter: Payer: Self-pay | Admitting: Obstetrics and Gynecology

## 2016-03-05 ENCOUNTER — Inpatient Hospital Stay (HOSPITAL_COMMUNITY): Admission: RE | Admit: 2016-03-05 | Payer: BLUE CROSS/BLUE SHIELD | Source: Ambulatory Visit

## 2016-03-18 NOTE — Telephone Encounter (Signed)
See message.

## 2016-03-26 IMAGING — CT CT HEAD W/O CM
2 series · 15 of 30 positions shown, 19 images · non-contrast
Comparison: None.

CLINICAL DATA: Acute onset of high blood pressure and dizziness.
Right-sided headache, radiating to the neck and right shoulder.
Photosensitivity. Initial encounter.

EXAM:
CT HEAD WITHOUT CONTRAST
TECHNIQUE: Contiguous axial images were obtained from the base of the skull
through the vertex without intravenous contrast.

[Series 2: head w/o · axial · non-contrast · 0.45mm/px · z∈[+1154,+1279]mm · 13 of 31 slices shown, 17 images]
[im 3/31  brain]
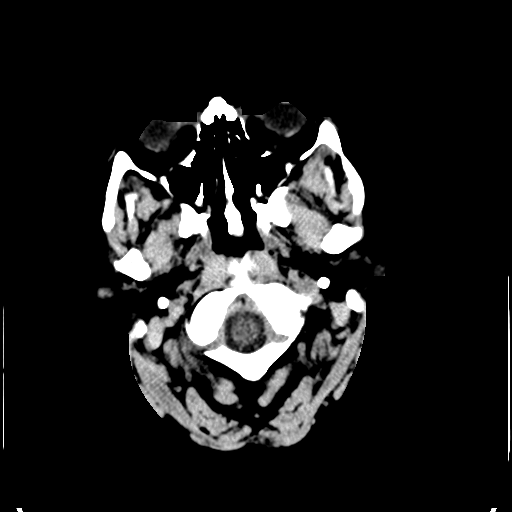
[im 3/31  bone]
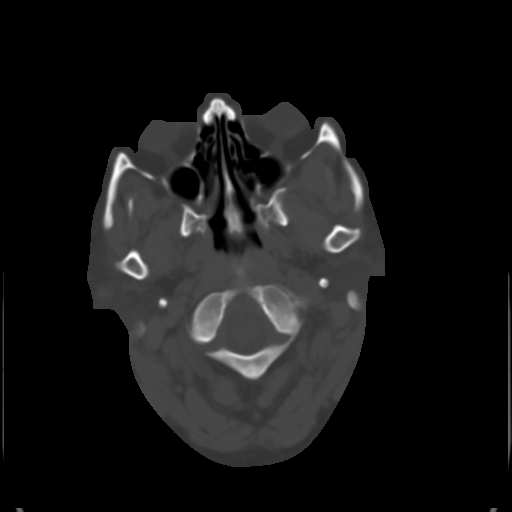
[im 5/31  brain]
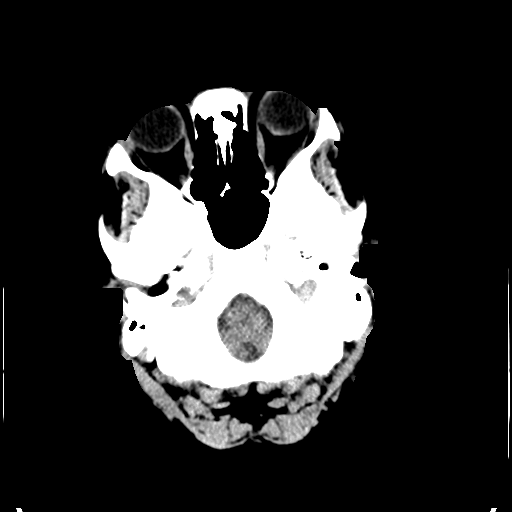
[im 7/31  brain]
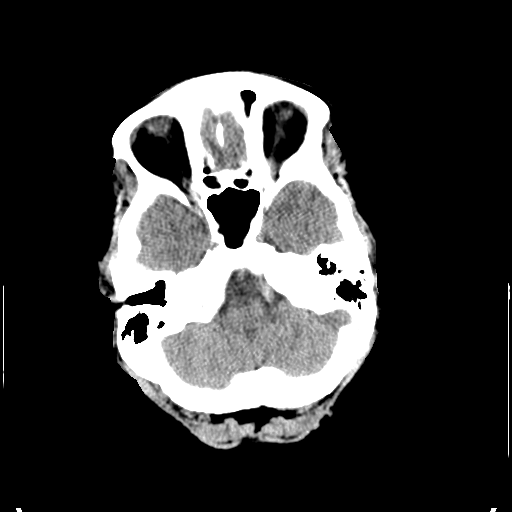
[im 9/31  brain]
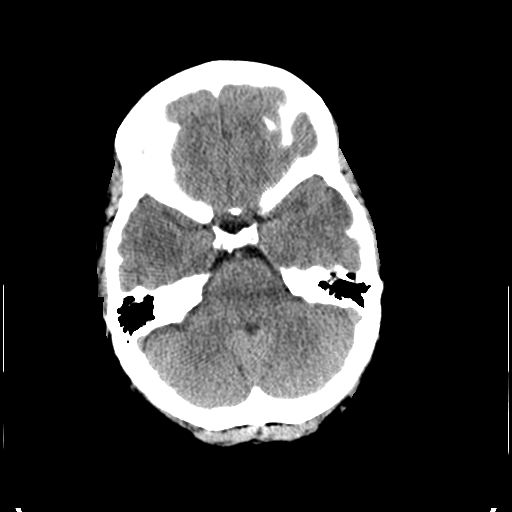
[im 11/31  brain]
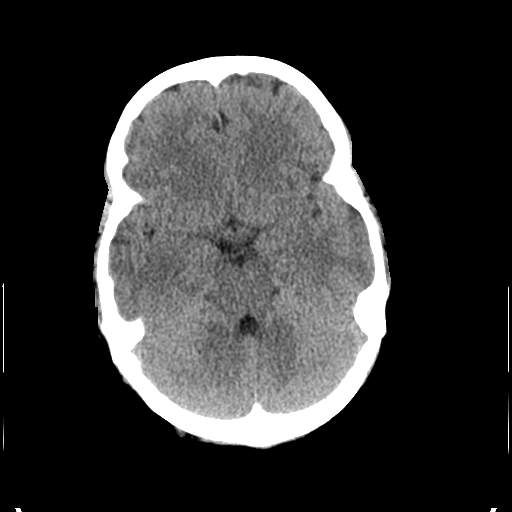
[im 11/31  bone]
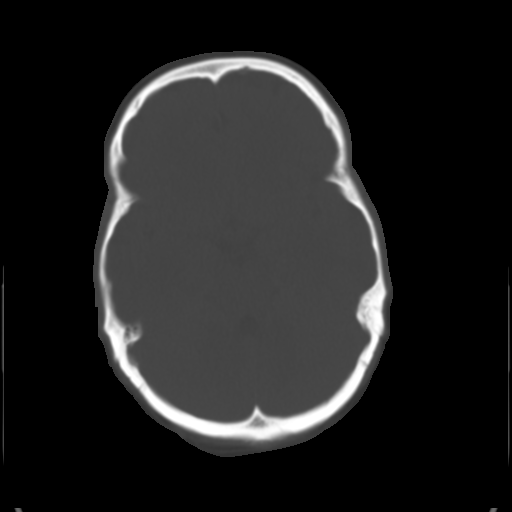
[im 13/31  brain]
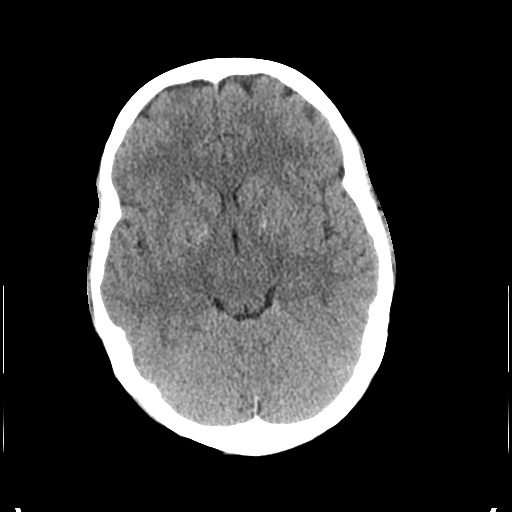
[im 16/31  brain]
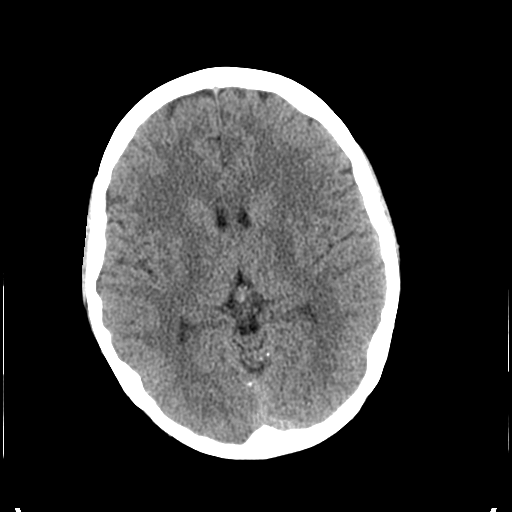
[im 18/31  brain]
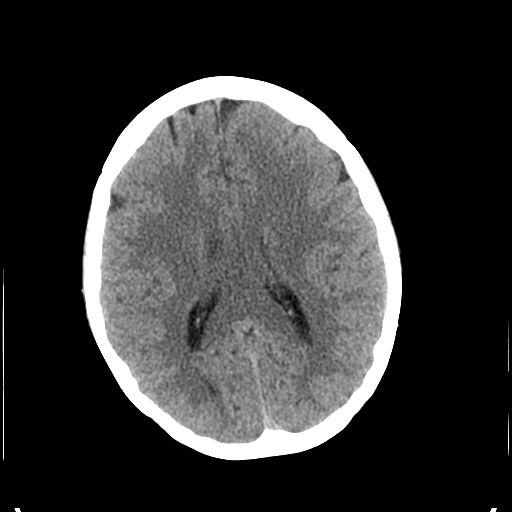
[im 20/31  brain]
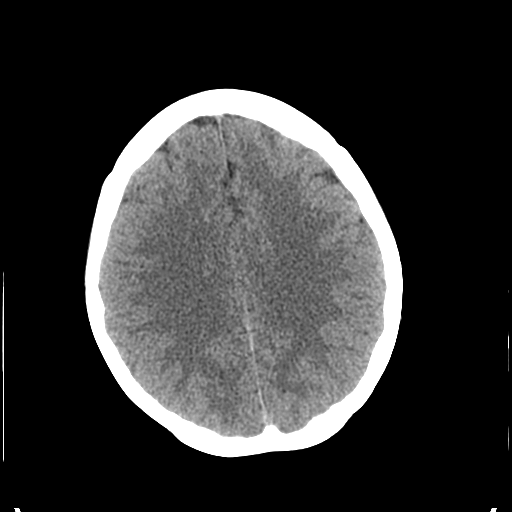
[im 20/31  bone]
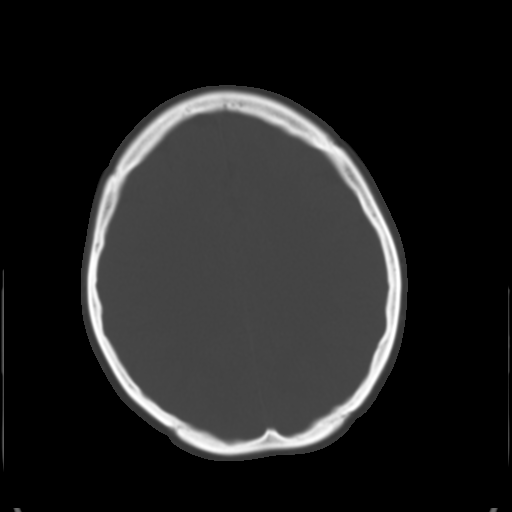
[im 22/31  brain]
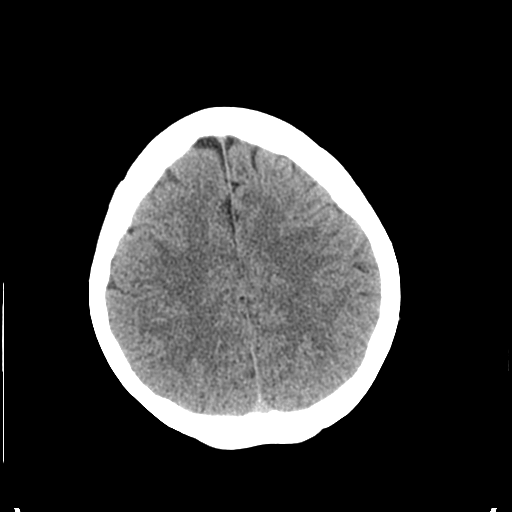
[im 24/31  brain]
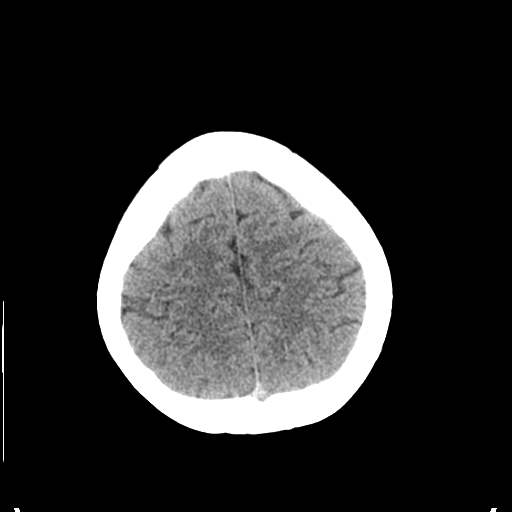
[im 26/31  brain]
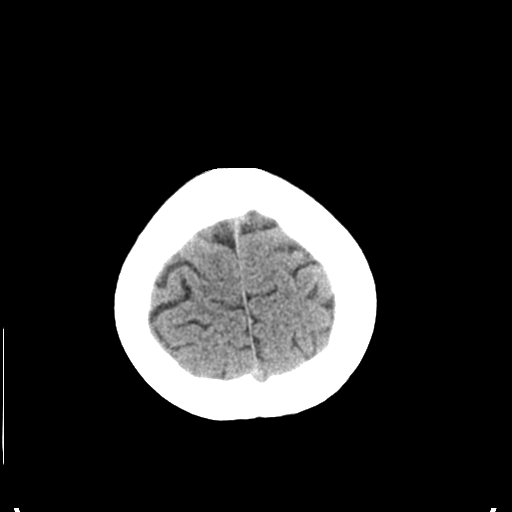
[im 28/31  brain]
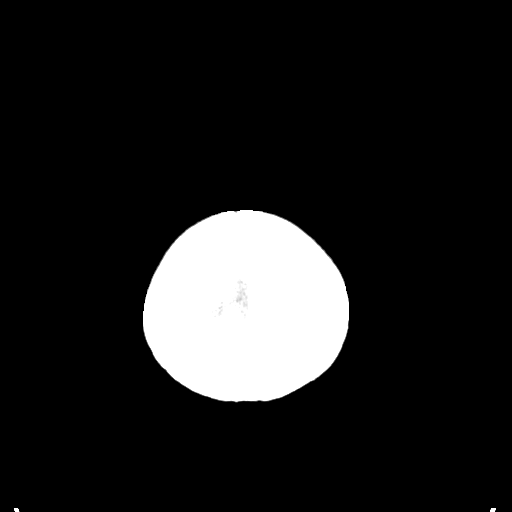
[im 28/31  bone]
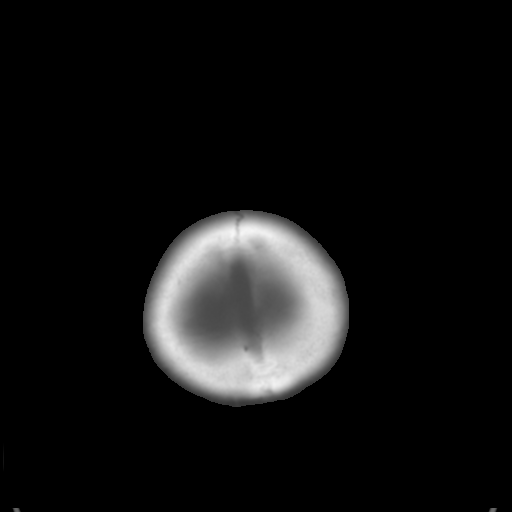

[Series 3: bone windows · axial · 0.45mm/px · z∈[+1154,+1174]mm · 2 of 31 slices shown]
[im 3/31  bone]
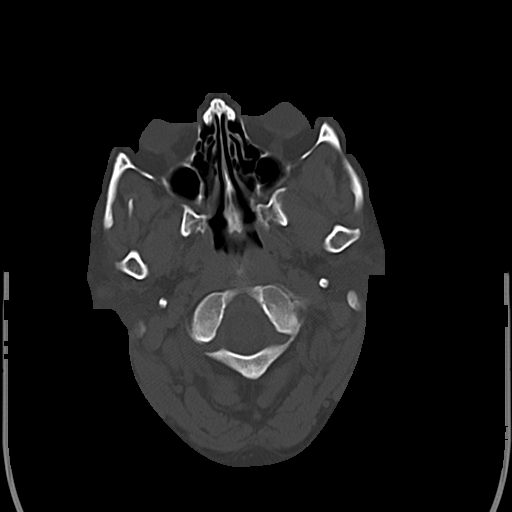
[im 7/31  bone]
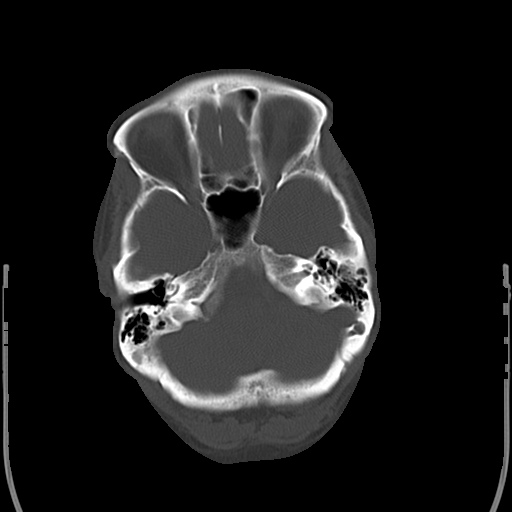

[15 of 30 positions shown; findings below may reference images not displayed]

FINDINGS: There is no evidence of acute infarction, mass lesion, or intra- or
extra-axial hemorrhage on CT.

The posterior fossa, including the cerebellum, brainstem and fourth
ventricle, is within normal limits. The third and lateral
ventricles, and basal ganglia are unremarkable in appearance. The
cerebral hemispheres are symmetric in appearance, with normal
gray-white differentiation. No mass effect or midline shift is seen.

There is no evidence of fracture; visualized osseous structures are
unremarkable in appearance. The orbits are within normal limits. The
paranasal sinuses and mastoid air cells are well-aerated. No
significant soft tissue abnormalities are seen.
IMPRESSION: Unremarkable noncontrast CT of the head.

## 2016-04-01 NOTE — Telephone Encounter (Signed)
Congrats card sent approx 4 weeks ago

## 2016-07-13 ENCOUNTER — Encounter: Payer: Self-pay | Admitting: Family Medicine

## 2016-07-13 ENCOUNTER — Ambulatory Visit (INDEPENDENT_AMBULATORY_CARE_PROVIDER_SITE_OTHER): Payer: BLUE CROSS/BLUE SHIELD | Admitting: Family Medicine

## 2016-07-13 VITALS — BP 124/80 | HR 82 | Temp 98.3°F | Resp 16 | Wt 156.2 lb

## 2016-07-13 DIAGNOSIS — R059 Cough, unspecified: Secondary | ICD-10-CM

## 2016-07-13 DIAGNOSIS — R05 Cough: Secondary | ICD-10-CM | POA: Diagnosis not present

## 2016-07-13 DIAGNOSIS — J029 Acute pharyngitis, unspecified: Secondary | ICD-10-CM

## 2016-07-13 LAB — POCT RAPID STREP A (OFFICE): Rapid Strep A Screen: NEGATIVE

## 2016-07-13 MED ORDER — AZITHROMYCIN 250 MG PO TABS: ORAL_TABLET | ORAL | 0 refills | Status: AC

## 2016-07-13 NOTE — Progress Notes (Signed)
Subjective: Chief Complaint  Patient presents with  . sore throat    sore throat and cough- mucous-yellow, browish.      Regina Stevens is a 35 y.o. female who presents for evaluation of a one week history of sore throat and cough.  She has not had a recent close exposure to someone with proven streptococcal pharyngitis.  Associated symptoms include coughing up yellowish sputum.  History of asthma and used albuterol inhaler 2 days ago but has not needed it since then.  Does not smoke.  No recent antibiotics.   Denies fever, chills, ear pain, sinus pain, wheezing, chest pain, GI or GU symptoms.   States she is no longer breast feeding.  Treatment to date: Tylenol cold and sinus and zyrtec.  No sick contacts.  No other aggravating or relieving factors.  No other c/o.  The following portions of the patient's history were reviewed and updated as appropriate: allergies, current medications, past medical history, past social history, past surgical history and problem list.  ROS as in subjective   Objective: Vitals:   07/13/16 1114  BP: 124/80  Pulse: 82  Resp: 16  Temp: 98.3 F (36.8 C)    General appearance: no distress, WD/WN, is not ill-appearing HEENT: normocephalic, conjunctiva/corneas normal, sclerae anicteric, nares with edema, clear discharge and erythema, pharynx with erythema, without exudate.  Oral cavity: MMM, no lesions  Neck: supple, no lymphadenopathy, no thyromegaly Heart: RRR, normal S1, S2, no murmurs Lungs: CTA bilaterally, no wheezes, rhonchi, or rales   Laboratory Strep test done. Results:negative.    Assessment and Plan: Acute pharyngitis, unspecified etiology - Plan: POCT rapid strep A  Cough   Advised that symptoms and exam suggest a viral etiology. She is not currently having an acute asthma flare.  Discussed symptomatic treatment including salt water gargles, warm fluids, rest, hydrate well, can use over-the-counter Tylenol or Ibuprofen for  throat pain, fever, or malaise. Mucinex DM or Robitussin DM for cough and congestion, she is not breastfeeding.  If worse or not improving within 2-3 days she will take the Z-pack prescribed today.  She also has a question regarding treatment for chronic migraine headaches and will call and discuss this with her PCP tomorrow. She is requesting that her dose of metoprolol be increased as her OB/GYN had increased it at one point but she no longer has this prescription.

## 2016-07-13 NOTE — Patient Instructions (Signed)
Stay well hydrated. Take the antibiotic and if you are not back to baseline at day 10 after starting it let us know.   Cough, Adult Coughing is a reflex that clears your throat and your airways. Coughing helps to heal and protect your lungs. It is normal to cough occasionally, but a cough that happens with other symptoms or lasts a long time may be a sign of a condition that needs treatment. A cough may last only 2-3 weeks (acute), or it may last longer than 8 weeks (chronic). What are the causes? Coughing is commonly caused by:  Breathing in substances that irritate your lungs.  A viral or bacterial respiratory infection.  Allergies.  Asthma.  Postnasal drip.  Smoking.  Acid backing up from the stomach into the esophagus (gastroesophageal reflux).  Certain medicines.  Chronic lung problems, including COPD (or rarely, lung cancer).  Other medical conditions such as heart failure. Follow these instructions at home: Pay attention to any changes in your symptoms. Take these actions to help with your discomfort:  Take medicines only as told by your health care provider.  If you were prescribed an antibiotic medicine, take it as told by your health care provider. Do not stop taking the antibiotic even if you start to feel better.  Talk with your health care provider before you take a cough suppressant medicine.  Drink enough fluid to keep your urine clear or pale yellow.  If the air is dry, use a cold steam vaporizer or humidifier in your bedroom or your home to help loosen secretions.  Avoid anything that causes you to cough at work or at home.  If your cough is worse at night, try sleeping in a semi-upright position.  Avoid cigarette smoke. If you smoke, quit smoking. If you need help quitting, ask your health care provider.  Avoid caffeine.  Avoid alcohol.  Rest as needed. Contact a health care provider if:  You have new symptoms.  You cough up pus.  Your cough  does not get better after 2-3 weeks, or your cough gets worse.  You cannot control your cough with suppressant medicines and you are losing sleep.  You develop pain that is getting worse or pain that is not controlled with pain medicines.  You have a fever.  You have unexplained weight loss.  You have night sweats. Get help right away if:  You cough up blood.  You have difficulty breathing.  Your heartbeat is very fast. This information is not intended to replace advice given to you by your health care provider. Make sure you discuss any questions you have with your health care provider. Document Released: 01/01/2011 Document Revised: 12/11/2015 Document Reviewed: 09/11/2014 [employer] Interactive Patient Education  2017 [employer]
